# Patient Record
Sex: Male | Born: 1955 | Race: White | Hispanic: No | Marital: Married | Smoking: Never smoker
Health system: Southern US, Community
[De-identification: ages and names within clinical notes are randomized; demographics above are authoritative.]

## PROBLEM LIST (undated history)

## (undated) DIAGNOSIS — R112 Nausea with vomiting, unspecified: Secondary | ICD-10-CM

## (undated) DIAGNOSIS — E039 Hypothyroidism, unspecified: Secondary | ICD-10-CM

## (undated) DIAGNOSIS — T7840XA Allergy, unspecified, initial encounter: Secondary | ICD-10-CM

## (undated) DIAGNOSIS — N529 Male erectile dysfunction, unspecified: Secondary | ICD-10-CM

## (undated) DIAGNOSIS — Z9889 Other specified postprocedural states: Secondary | ICD-10-CM

## (undated) HISTORY — DX: Hypothyroidism, unspecified: E03.9

## (undated) HISTORY — PX: CARPAL TUNNEL RELEASE: SHX101

## (undated) HISTORY — PX: MENISCUS REPAIR: SHX5179

## (undated) HISTORY — PX: APPENDECTOMY: SHX54

## (undated) HISTORY — PX: TONSILLECTOMY: SUR1361

## (undated) HISTORY — PX: UMBILICAL HERNIA REPAIR: SHX196

## (undated) HISTORY — PX: OTHER SURGICAL HISTORY: SHX169

## (undated) HISTORY — DX: Allergy, unspecified, initial encounter: T78.40XA

## (undated) HISTORY — PX: SHOULDER SURGERY: SHX246

## (undated) HISTORY — DX: Male erectile dysfunction, unspecified: N52.9

## (undated) HISTORY — PX: COLONOSCOPY: SHX174

## (undated) HISTORY — DX: Other specified postprocedural states: Z98.890

## (undated) HISTORY — DX: Nausea with vomiting, unspecified: R11.2

## (undated) HISTORY — DX: Gilbert syndrome: E80.4

## (undated) HISTORY — PX: POLYPECTOMY: SHX149

---

## 2004-05-10 ENCOUNTER — Ambulatory Visit: Payer: Self-pay | Admitting: Internal Medicine

## 2004-05-27 ENCOUNTER — Ambulatory Visit: Payer: Self-pay | Admitting: Gastroenterology

## 2004-05-31 ENCOUNTER — Ambulatory Visit: Payer: Self-pay | Admitting: Internal Medicine

## 2004-05-31 ENCOUNTER — Ambulatory Visit: Payer: Self-pay | Admitting: Gastroenterology

## 2005-02-20 ENCOUNTER — Ambulatory Visit: Payer: Self-pay | Admitting: Internal Medicine

## 2005-04-07 ENCOUNTER — Ambulatory Visit: Payer: Self-pay | Admitting: Internal Medicine

## 2005-06-24 ENCOUNTER — Ambulatory Visit: Payer: Self-pay | Admitting: Internal Medicine

## 2005-10-01 ENCOUNTER — Ambulatory Visit (HOSPITAL_BASED_OUTPATIENT_CLINIC_OR_DEPARTMENT_OTHER): Admission: RE | Admit: 2005-10-01 | Discharge: 2005-10-01 | Payer: Self-pay | Admitting: Orthopedic Surgery

## 2006-04-20 ENCOUNTER — Ambulatory Visit: Payer: Self-pay | Admitting: Internal Medicine

## 2006-04-20 LAB — CONVERTED CEMR LAB: TSH: 4.92 microintl units/mL (ref 0.35–5.50)

## 2006-05-19 ENCOUNTER — Ambulatory Visit (HOSPITAL_BASED_OUTPATIENT_CLINIC_OR_DEPARTMENT_OTHER): Admission: RE | Admit: 2006-05-19 | Discharge: 2006-05-19 | Payer: Self-pay | Admitting: Orthopedic Surgery

## 2006-07-24 ENCOUNTER — Ambulatory Visit: Payer: Self-pay | Admitting: Internal Medicine

## 2006-07-24 LAB — CONVERTED CEMR LAB
ALT: 22 units/L (ref 0–40)
AST: 25 units/L (ref 0–37)
Albumin: 4.3 g/dL (ref 3.5–5.2)
Alkaline Phosphatase: 35 units/L — ABNORMAL LOW (ref 39–117)
BUN: 16 mg/dL (ref 6–23)
Basophils Absolute: 0 10*3/uL (ref 0.0–0.1)
Basophils Relative: 0.5 % (ref 0.0–1.0)
Bilirubin, Direct: 0.1 mg/dL (ref 0.0–0.3)
CO2: 28 meq/L (ref 19–32)
Calcium: 9.6 mg/dL (ref 8.4–10.5)
Chloride: 107 meq/L (ref 96–112)
Cholesterol: 202 mg/dL (ref 0–200)
Creatinine, Ser: 1.4 mg/dL (ref 0.4–1.5)
Direct LDL: 103.1 mg/dL
Eosinophils Absolute: 0.1 10*3/uL (ref 0.0–0.6)
Eosinophils Relative: 2.1 % (ref 0.0–5.0)
GFR calc Af Amer: 70 mL/min
GFR calc non Af Amer: 57 mL/min
Glucose, Bld: 86 mg/dL (ref 70–99)
HCT: 41.5 % (ref 39.0–52.0)
HDL: 73.2 mg/dL (ref >39.0)
Hemoglobin: 14.6 g/dL (ref 13.0–17.0)
Lymphocytes Relative: 41 % (ref 12.0–46.0)
MCHC: 35.2 g/dL (ref 30.0–36.0)
MCV: 92.5 fL (ref 78.0–100.0)
Monocytes Absolute: 0.5 10*3/uL (ref 0.2–0.7)
Monocytes Relative: 11.4 % — ABNORMAL HIGH (ref 3.0–11.0)
Neutro Abs: 2.2 10*3/uL (ref 1.4–7.7)
Neutrophils Relative %: 45 % (ref 43.0–77.0)
PSA: 0.98 ng/mL (ref 0.10–4.00)
Platelets: 210 10*3/uL (ref 150–400)
Potassium: 4.4 meq/L (ref 3.5–5.1)
RBC: 4.49 M/uL (ref 4.22–5.81)
RDW: 12.7 % (ref 11.5–14.6)
Sodium: 143 meq/L (ref 135–145)
TSH: 2.48 microintl units/mL (ref 0.35–5.50)
Total Bilirubin: 0.9 mg/dL (ref 0.3–1.2)
Total CHOL/HDL Ratio: 2.8
Total Protein: 6.8 g/dL (ref 6.0–8.3)
Triglycerides: 83 mg/dL (ref 0–149)
VLDL: 17 mg/dL (ref 0–40)
WBC: 4.8 10*3/uL (ref 4.5–10.5)

## 2006-10-15 DIAGNOSIS — Z9089 Acquired absence of other organs: Secondary | ICD-10-CM | POA: Insufficient documentation

## 2007-06-15 ENCOUNTER — Encounter (INDEPENDENT_AMBULATORY_CARE_PROVIDER_SITE_OTHER): Payer: Self-pay | Admitting: *Deleted

## 2007-06-15 ENCOUNTER — Telehealth (INDEPENDENT_AMBULATORY_CARE_PROVIDER_SITE_OTHER): Payer: Self-pay | Admitting: *Deleted

## 2007-06-16 ENCOUNTER — Telehealth (INDEPENDENT_AMBULATORY_CARE_PROVIDER_SITE_OTHER): Payer: Self-pay | Admitting: *Deleted

## 2007-06-18 ENCOUNTER — Encounter: Payer: Self-pay | Admitting: Internal Medicine

## 2007-08-30 ENCOUNTER — Ambulatory Visit: Payer: Self-pay | Admitting: Internal Medicine

## 2007-08-30 DIAGNOSIS — N400 Enlarged prostate without lower urinary tract symptoms: Secondary | ICD-10-CM | POA: Insufficient documentation

## 2007-08-30 DIAGNOSIS — E039 Hypothyroidism, unspecified: Secondary | ICD-10-CM | POA: Insufficient documentation

## 2007-08-30 DIAGNOSIS — J309 Allergic rhinitis, unspecified: Secondary | ICD-10-CM | POA: Insufficient documentation

## 2007-09-02 LAB — CONVERTED CEMR LAB
ALT: 23 units/L (ref 0–53)
AST: 28 units/L (ref 0–37)
Albumin: 4.3 g/dL (ref 3.5–5.2)
Alkaline Phosphatase: 35 units/L — ABNORMAL LOW (ref 39–117)
BUN: 16 mg/dL (ref 6–23)
Basophils Absolute: 0 10*3/uL (ref 0.0–0.1)
Basophils Relative: 0.6 % (ref 0.0–1.0)
Bilirubin, Direct: 0.1 mg/dL (ref 0.0–0.3)
CO2: 29 meq/L (ref 19–32)
Calcium: 9.7 mg/dL (ref 8.4–10.5)
Chloride: 107 meq/L (ref 96–112)
Cholesterol: 203 mg/dL (ref 0–200)
Creatinine, Ser: 1.3 mg/dL (ref 0.4–1.5)
Direct LDL: 107.2 mg/dL
Eosinophils Absolute: 0.1 10*3/uL (ref 0.0–0.6)
Eosinophils Relative: 2.5 % (ref 0.0–5.0)
GFR calc Af Amer: 75 mL/min
GFR calc non Af Amer: 62 mL/min
Glucose, Bld: 91 mg/dL (ref 70–99)
HCT: 42.5 % (ref 39.0–52.0)
HDL: 74.6 mg/dL (ref >39.0)
Hemoglobin: 14.2 g/dL (ref 13.0–17.0)
Lymphocytes Relative: 42.2 % (ref 12.0–46.0)
MCHC: 33.3 g/dL (ref 30.0–36.0)
MCV: 96.1 fL (ref 78.0–100.0)
Monocytes Absolute: 0.4 10*3/uL (ref 0.2–0.7)
Monocytes Relative: 11.2 % — ABNORMAL HIGH (ref 3.0–11.0)
Neutro Abs: 1.7 10*3/uL (ref 1.4–7.7)
Neutrophils Relative %: 43.5 % (ref 43.0–77.0)
PSA: 0.81 ng/mL (ref 0.10–4.00)
Platelets: 207 10*3/uL (ref 150–400)
Potassium: 4.5 meq/L (ref 3.5–5.1)
RBC: 4.42 M/uL (ref 4.22–5.81)
RDW: 12.8 % (ref 11.5–14.6)
Sodium: 143 meq/L (ref 135–145)
TSH: 6.25 microintl units/mL — ABNORMAL HIGH (ref 0.35–5.50)
Total Bilirubin: 0.9 mg/dL (ref 0.3–1.2)
Total CHOL/HDL Ratio: 2.7
Total Protein: 7 g/dL (ref 6.0–8.3)
Triglycerides: 43 mg/dL (ref 0–149)
VLDL: 9 mg/dL (ref 0–40)
WBC: 4 10*3/uL — ABNORMAL LOW (ref 4.5–10.5)

## 2007-09-06 ENCOUNTER — Telehealth (INDEPENDENT_AMBULATORY_CARE_PROVIDER_SITE_OTHER): Payer: Self-pay | Admitting: *Deleted

## 2007-09-06 ENCOUNTER — Ambulatory Visit: Payer: Self-pay | Admitting: Internal Medicine

## 2007-09-06 ENCOUNTER — Encounter (INDEPENDENT_AMBULATORY_CARE_PROVIDER_SITE_OTHER): Payer: Self-pay | Admitting: *Deleted

## 2007-09-06 LAB — CONVERTED CEMR LAB
OCCULT 1: NEGATIVE
OCCULT 2: NEGATIVE
OCCULT 3: NEGATIVE

## 2008-08-29 ENCOUNTER — Encounter (INDEPENDENT_AMBULATORY_CARE_PROVIDER_SITE_OTHER): Payer: Self-pay | Admitting: *Deleted

## 2008-09-06 ENCOUNTER — Telehealth (INDEPENDENT_AMBULATORY_CARE_PROVIDER_SITE_OTHER): Payer: Self-pay | Admitting: *Deleted

## 2008-09-12 ENCOUNTER — Telehealth (INDEPENDENT_AMBULATORY_CARE_PROVIDER_SITE_OTHER): Payer: Self-pay | Admitting: *Deleted

## 2008-09-13 ENCOUNTER — Ambulatory Visit: Payer: Self-pay | Admitting: Internal Medicine

## 2008-09-19 ENCOUNTER — Telehealth (INDEPENDENT_AMBULATORY_CARE_PROVIDER_SITE_OTHER): Payer: Self-pay | Admitting: *Deleted

## 2008-09-19 LAB — CONVERTED CEMR LAB
ALT: 50 units/L (ref 0–53)
AST: 30 units/L (ref 0–37)
Albumin: 3.9 g/dL (ref 3.5–5.2)
Alkaline Phosphatase: 66 units/L (ref 39–117)
Basophils Relative: 0.8 % (ref 0.0–3.0)
CO2: 30 meq/L (ref 19–32)
Calcium: 9.5 mg/dL (ref 8.4–10.5)
GFR calc non Af Amer: 67.83 mL/min (ref >60)
HDL: 73.3 mg/dL (ref >39.00)
Hemoglobin: 13.9 g/dL (ref 13.0–17.0)
Lymphocytes Relative: 36.1 % (ref 12.0–46.0)
MCHC: 33.4 g/dL (ref 30.0–36.0)
Monocytes Relative: 11.2 % (ref 3.0–12.0)
Neutro Abs: 2.1 10*3/uL (ref 1.4–7.7)
RBC: 4.36 M/uL (ref 4.22–5.81)
Sodium: 144 meq/L (ref 135–145)
Total CHOL/HDL Ratio: 2
Total Protein: 6.5 g/dL (ref 6.0–8.3)

## 2008-10-06 ENCOUNTER — Telehealth (INDEPENDENT_AMBULATORY_CARE_PROVIDER_SITE_OTHER): Payer: Self-pay | Admitting: *Deleted

## 2008-10-11 ENCOUNTER — Ambulatory Visit: Payer: Self-pay | Admitting: Internal Medicine

## 2008-10-11 DIAGNOSIS — R972 Elevated prostate specific antigen [PSA]: Secondary | ICD-10-CM | POA: Insufficient documentation

## 2008-10-13 ENCOUNTER — Encounter (INDEPENDENT_AMBULATORY_CARE_PROVIDER_SITE_OTHER): Payer: Self-pay | Admitting: *Deleted

## 2008-10-23 ENCOUNTER — Telehealth (INDEPENDENT_AMBULATORY_CARE_PROVIDER_SITE_OTHER): Payer: Self-pay | Admitting: *Deleted

## 2009-01-10 ENCOUNTER — Telehealth (INDEPENDENT_AMBULATORY_CARE_PROVIDER_SITE_OTHER): Payer: Self-pay | Admitting: *Deleted

## 2009-04-13 ENCOUNTER — Encounter (INDEPENDENT_AMBULATORY_CARE_PROVIDER_SITE_OTHER): Payer: Self-pay | Admitting: *Deleted

## 2009-07-09 ENCOUNTER — Telehealth (INDEPENDENT_AMBULATORY_CARE_PROVIDER_SITE_OTHER): Payer: Self-pay | Admitting: *Deleted

## 2009-09-07 ENCOUNTER — Ambulatory Visit: Payer: Self-pay | Admitting: Family Medicine

## 2009-09-27 ENCOUNTER — Telehealth (INDEPENDENT_AMBULATORY_CARE_PROVIDER_SITE_OTHER): Payer: Self-pay | Admitting: *Deleted

## 2009-10-29 ENCOUNTER — Ambulatory Visit: Payer: Self-pay | Admitting: Internal Medicine

## 2009-11-06 LAB — CONVERTED CEMR LAB
ALT: 24 units/L (ref 0–53)
AST: 34 units/L (ref 0–37)
Alkaline Phosphatase: 40 units/L (ref 39–117)
Eosinophils Relative: 3.4 % (ref 0.0–5.0)
GFR calc non Af Amer: 68.19 mL/min (ref >60)
HCT: 40.6 % (ref 39.0–52.0)
Hemoglobin: 13.9 g/dL (ref 13.0–17.0)
LDL Cholesterol: 110 mg/dL — ABNORMAL HIGH (ref 0–99)
Lymphocytes Relative: 37.7 % (ref 12.0–46.0)
Lymphs Abs: 1.6 10*3/uL (ref 0.7–4.0)
Monocytes Relative: 12.8 % — ABNORMAL HIGH (ref 3.0–12.0)
Neutro Abs: 1.9 10*3/uL (ref 1.4–7.7)
Platelets: 189 10*3/uL (ref 150.0–400.0)
Potassium: 5.1 meq/L (ref 3.5–5.1)
Sodium: 140 meq/L (ref 135–145)
TSH: 5.46 microintl units/mL (ref 0.35–5.50)
Total Bilirubin: 1.1 mg/dL (ref 0.3–1.2)
VLDL: 4.2 mg/dL (ref 0.0–40.0)
WBC: 4.2 10*3/uL — ABNORMAL LOW (ref 4.5–10.5)

## 2010-02-20 ENCOUNTER — Encounter (INDEPENDENT_AMBULATORY_CARE_PROVIDER_SITE_OTHER): Payer: Self-pay | Admitting: *Deleted

## 2010-02-22 ENCOUNTER — Ambulatory Visit: Payer: Self-pay | Admitting: Gastroenterology

## 2010-03-01 ENCOUNTER — Ambulatory Visit: Payer: Self-pay | Admitting: Gastroenterology

## 2010-05-13 ENCOUNTER — Telehealth: Payer: Self-pay | Admitting: Internal Medicine

## 2010-05-15 ENCOUNTER — Encounter: Payer: Self-pay | Admitting: Internal Medicine

## 2010-05-16 ENCOUNTER — Encounter: Payer: Self-pay | Admitting: Internal Medicine

## 2010-06-17 ENCOUNTER — Telehealth: Payer: Self-pay | Admitting: Internal Medicine

## 2010-06-21 ENCOUNTER — Ambulatory Visit
Admission: RE | Admit: 2010-06-21 | Discharge: 2010-06-21 | Payer: Self-pay | Source: Home / Self Care | Attending: Internal Medicine | Admitting: Internal Medicine

## 2010-06-21 ENCOUNTER — Other Ambulatory Visit: Payer: Self-pay | Admitting: Internal Medicine

## 2010-06-21 LAB — CBC WITH DIFFERENTIAL/PLATELET
Basophils Absolute: 0 10*3/uL (ref 0.0–0.1)
Basophils Relative: 0.5 % (ref 0.0–3.0)
Eosinophils Absolute: 0.1 10*3/uL (ref 0.0–0.7)
Eosinophils Relative: 1.7 % (ref 0.0–5.0)
HCT: 43.4 % (ref 39.0–52.0)
Hemoglobin: 14.6 g/dL (ref 13.0–17.0)
Lymphocytes Relative: 38.4 % (ref 12.0–46.0)
Lymphs Abs: 1.7 10*3/uL (ref 0.7–4.0)
MCHC: 33.7 g/dL (ref 30.0–36.0)
MCV: 96.1 fl (ref 78.0–100.0)
Monocytes Absolute: 0.5 10*3/uL (ref 0.1–1.0)
Monocytes Relative: 12.1 % — ABNORMAL HIGH (ref 3.0–12.0)
Neutro Abs: 2.1 10*3/uL (ref 1.4–7.7)
Neutrophils Relative %: 47.3 % (ref 43.0–77.0)
Platelets: 193 10*3/uL (ref 150.0–400.0)
RBC: 4.52 Mil/uL (ref 4.22–5.81)
RDW: 13.7 % (ref 11.5–14.6)
WBC: 4.5 10*3/uL (ref 4.5–10.5)

## 2010-06-21 LAB — HEPATIC FUNCTION PANEL
ALT: 21 U/L (ref 0–53)
AST: 28 U/L (ref 0–37)
Albumin: 4.5 g/dL (ref 3.5–5.2)
Alkaline Phosphatase: 39 U/L (ref 39–117)
Bilirubin, Direct: 0.1 mg/dL (ref 0.0–0.3)
Total Bilirubin: 0.9 mg/dL (ref 0.3–1.2)
Total Protein: 7.2 g/dL (ref 6.0–8.3)

## 2010-06-21 LAB — BASIC METABOLIC PANEL
BUN: 15 mg/dL (ref 6–23)
CO2: 30 mEq/L (ref 19–32)
Calcium: 10.4 mg/dL (ref 8.4–10.5)
Chloride: 107 mEq/L (ref 96–112)
Creatinine, Ser: 1.4 mg/dL (ref 0.4–1.5)
GFR: 54.58 mL/min — ABNORMAL LOW (ref >60.00)
Glucose, Bld: 85 mg/dL (ref 70–99)
Potassium: 5.7 mEq/L — ABNORMAL HIGH (ref 3.5–5.1)
Sodium: 144 mEq/L (ref 135–145)

## 2010-06-21 LAB — LIPID PANEL
Cholesterol: 190 mg/dL (ref 0–200)
HDL: 75.7 mg/dL (ref >39.00)
LDL Cholesterol: 104 mg/dL — ABNORMAL HIGH (ref 0–99)
Total CHOL/HDL Ratio: 3
Triglycerides: 52 mg/dL (ref 0.0–149.0)
VLDL: 10.4 mg/dL (ref 0.0–40.0)

## 2010-06-21 LAB — CONVERTED CEMR LAB
Ketones, urine, test strip: NEGATIVE
Nitrite: NEGATIVE
Protein, U semiquant: NEGATIVE
Urobilinogen, UA: NEGATIVE

## 2010-06-21 LAB — TSH: TSH: 2.69 u[IU]/mL (ref 0.35–5.50)

## 2010-06-21 LAB — TESTOSTERONE: Testosterone: 534.81 ng/dL (ref 350.00–890.00)

## 2010-06-21 LAB — PSA: PSA: 0.94 ng/mL (ref 0.10–4.00)

## 2010-07-01 ENCOUNTER — Telehealth (INDEPENDENT_AMBULATORY_CARE_PROVIDER_SITE_OTHER): Payer: Self-pay | Admitting: *Deleted

## 2010-07-03 ENCOUNTER — Ambulatory Visit
Admission: RE | Admit: 2010-07-03 | Discharge: 2010-07-03 | Payer: Self-pay | Source: Home / Self Care | Attending: Internal Medicine | Admitting: Internal Medicine

## 2010-07-03 LAB — CONVERTED CEMR LAB
OCCULT 1: NEGATIVE
OCCULT 2: NEGATIVE

## 2010-07-04 ENCOUNTER — Ambulatory Visit
Admission: RE | Admit: 2010-07-04 | Discharge: 2010-07-04 | Payer: Self-pay | Source: Home / Self Care | Attending: Internal Medicine | Admitting: Internal Medicine

## 2010-07-04 DIAGNOSIS — F528 Other sexual dysfunction not due to a substance or known physiological condition: Secondary | ICD-10-CM | POA: Insufficient documentation

## 2010-07-04 DIAGNOSIS — R41840 Attention and concentration deficit: Secondary | ICD-10-CM | POA: Insufficient documentation

## 2010-07-09 NOTE — Progress Notes (Signed)
Summary: REFILL VOLTAREN  Phone Note Call from Patient Call back at Home Phone [phone]   Caller: Patient Call For: Marga Melnick MD Reason for Call: Refill Medication Summary of Call: PATIENT CALLING REQUESTING REFILL OF VOLTAREN FOR HIS TENDONITIS, BE SENT TO [facility] [address]  PT LAST OV 10/2008, HE IS SCH'D FOR CPX IN MAY-2011.  PLEASE LET PT KNOW WHEN DONE, OK TO LEAVE VOICEMAIL. Initial call taken by: Magdalen Spatz [facility],  July 09, 2009 2:08 PM  Follow-up for Phone Call        Patient aware rx sent Follow-up by: Shonna Chock,  July 09, 2009 2:35 PM    Prescriptions: VOLTAREN 1 % GEL (DICLOFENAC SODIUM) apply two times a day after  elbow exercises  #100 grams x 2   Entered by:   Shonna Chock   Authorized by:   Marga Melnick MD   Signed by:   Shonna Chock on 07/09/2009   Method used:   Electronically to        [facility] [phone]* (retail)       [address]       [place], [place]  [address]       Ph: [phone] or [phone]       Fax: [phone]   RxID:   [id number]

## 2010-07-09 NOTE — Progress Notes (Signed)
Summary: PRIOR AUTH--Voltaren  Phone Note Refill Request Message from:  Fax from Pharmacy on May 13, 2010 4:15 PM  Refills Requested: Medication #1:  VOLTAREN 1 % GEL apply two times a day after  elbow exercises Allena Katz [id number] - ID [id number]  Initial call taken by: Dannielle Karvonen,  May 13, 2010 4:15 PM  Follow-up for Phone Call        Forms requested. Lucious Groves CMA  May 14, 2010 3:24 PM   Additional Follow-up for Phone Call Additional follow up Details #1::        see form Additional Follow-up by: Marga Melnick MD,  May 15, 2010 11:56 AM    Additional Follow-up for Phone Call Additional follow up Details #2::    Faxed and will await insurance company reply. Lucious Groves CMA  May 15, 2010 12:12 PM    Appended Document: PRIOR AUTH--Voltaren approved until 02/2013.

## 2010-07-09 NOTE — Miscellaneous (Signed)
Summary: previsit prep/rm  Clinical Lists Changes  Medications: Added new medication of MOVIPREP 100 GM  SOLR (PEG-KCL-NACL-NASULF-NA ASC-C) As per prep instructions. - Signed Rx of MOVIPREP 100 GM  SOLR (PEG-KCL-NACL-NASULF-NA ASC-C) As per prep instructions.;  #1 x 0;  Signed;  Entered by: Sherren Kerns RN;  Authorized by: Meryl Dare MD [facility];  Method used: Electronically to [facility]  [phone]*, [address] [place], [place]  [address], Ph: [phone] or [phone], Fax: [phone]    Prescriptions: MOVIPREP 100 GM  SOLR (PEG-KCL-NACL-NASULF-NA ASC-C) As per prep instructions.  #1 x 0   Entered by:   Sherren Kerns RN   Authorized by:   Meryl Dare MD [facility]   Signed by:   Sherren Kerns RN on 02/22/2010   Method used:   Electronically to        [facility]  [phone]* (retail)       [address] [place], [place]  [address]       Ph: [phone] or [phone]       Fax: [phone]   RxID:   [id number]

## 2010-07-09 NOTE — Progress Notes (Signed)
Summary: HOP---RX  Phone Note Refill Request   Refills Requested: Medication #1:  SYNTHROID 150 MCG  TABS 1po once daily except 1 and 1/2 tabs on tues and thurs [facility]--[phone]  Initial call taken by: Freddy Jaksch,  September 12, 2008 9:06 AM      Prescriptions: SYNTHROID 150 MCG  TABS (LEVOTHYROXINE SODIUM) 1po once daily except 1 and 1/2 tabs on tues and thurs  #102 x 0   Entered by:   Jeremy Johann CMA   Authorized by:   Marga Melnick MD   Signed by:   Jeremy Johann CMA on 09/12/2008   Method used:   Faxed to ...       [facility] [address] (retail)       [address]       [place], [place]  [address]       Ph: [phone] or [phone]       Fax: [phone]   RxID:   [id number]

## 2010-07-09 NOTE — Assessment & Plan Note (Signed)
Summary: cpx/ns/kdc   Vital Signs:  Patient profile:   55 year old male Height:      74 inches Weight:      192.4 pounds BMI:     24.79 Temp:     97.6 degrees F oral Pulse rate:   45 / minute Resp:     14 per minute BP sitting:   110 / 70  (left arm) Cuff size:   large  Vitals Entered By: Shonna Chock (Oct 29, 2009 8:35 AM)  Comments REVIEWED MED LIST, PATIENT AGREED DOSE AND INSTRUCTION CORRECT    Primary Care Provider:  [facility]   History of Present Illness: Timothy [name] is here for a physical; he is asymptomatic.  Allergies (verified): No Known Drug Allergies  Past History:  Past Medical History: 1999 post allergic Reactive Airway Disease (self limited brochosasm); no recurrence , no PMH asthma GILBERT'S SYNDROME (ICD-277.4) insignificant click at apex elevated homocysteine , normal lipids, normal CRP; NMR 06/2005: LDL 100 with 818 total particles & 167 small dense particles(LDL goal = < 160, ideally < 120) Hypothyroidism  Past Surgical History: Appendectomy Tonsillectomy colonoscopy  05/2004 negative, due 2011 CTS 10/18/2005, surgery bilaterally Benign nevi removed X3, Dr Danella Deis, Dermatologist  Family History: father: thyroid disease, colon polyps , MI @ 43 mother: thyroid disease, MI,d  @ 72, colon polyps three brothers: hypothyroidism; bro : melanoma. 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    Physical Exam  General:  Well-developed,well-nourished; alert,appropriate and cooperative throughout examination Head:  Normocephalic and atraumatic without obvious abnormalities. No apparent alopecia Eyes:  No corneal or conjunctival inflammation noted.Perrla. Funduscopic exam benign, without hemorrhages, exudates or papilledema.  Ears:  External ear exam shows no significant lesions or deformities.  Otoscopic examination reveals clear canals, tympanic membranes are intact bilaterally without bulging, retraction, inflammation or discharge. Hearing is grossly normal bilaterally. Nose:  External nasal examination shows no deformity or inflammation. Nasal mucosa are pink and moist without lesions or exudates. Mouth:  Oral mucosa and oropharynx without lesions or exudates.  Teeth in good repair. Neck:  No deformities, masses, or tenderness noted. Lungs:  Normal respiratory effort, chest expands symmetrically. Lungs are clear to auscultation, no crackles or wheezes. Heart:  regular rhythm, no murmur, no gallop, no rub, no JVD, no HJR, and bradycardia.  Intermittent click @ apex Abdomen:  Bowel sounds positive,abdomen scaphoid, soft and non-tender without masses, organomegaly or hernias noted. Rectal:  No external abnormalities noted. Normal sphincter tone. No rectal masses or tenderness. Genitalia:  Testes bilaterally descended without nodularity, tenderness or masses. No scrotal masses or lesions. No penis lesions or urethral discharge.L varicocele.   Prostate:  no nodules, no asymmetry, no induration, and 1+ enlarged.   Msk:  No deformity or scoliosis noted of thoracic or lumbar spine.   Pulses:  R and L carotid,radial,dorsalis pedis and posterior tibial pulses are full and equal bilaterally Extremities:  No clubbing, cyanosis, edema, or deformity noted with normal full range of motion of all joints.   Neurologic:  alert &  oriented X3 and DTRs symmetrical  and normal.   Skin:  Solar changes; scattered nevi Cervical Nodes:  No lymphadenopathy noted Axillary Nodes:  No palpable lymphadenopathy Inguinal Nodes:  No significant adenopathy Psych:  memory intact for recent and remote, normally interactive, and good eye contact.     Impression & Recommendations:  Problem # 1:  ROUTINE GENERAL MEDICAL EXAM@HEALTH  CARE FACL (ICD-V70.0)  Orders: EKG w/ Interpretation ([address]) Venipuncture ([address]) TLB-Lipid Panel (80061-LIPID) TLB-BMP (Basic Metabolic Panel-BMET) (80048-METABOL) TLB-CBC Platelet - w/Differential (85025-CBCD) TLB-Hepatic/Liver Function Pnl (80076-HEPATIC) TLB-TSH (Thyroid Stimulating Hormone) (84443-TSH) TLB-PSA (Prostate Specific Antigen) (84153-PSA)  Problem # 2:  HYPOTHYROIDISM (ICD-244.9)  His updated medication list for this problem includes:    Synthroid 150 Mcg Tabs (Levothyroxine sodium) .Marland Kitchen... 1po once daily except 1 and 1/2 tabs on tues and thurs  Orders: TLB-TSH (Thyroid Stimulating Hormone) (84443-TSH)  Problem # 3:  HYPERPLASIA PROSTATE UNS W/O UR OBST & OTH LUTS (ICD-600.90)  Orders: TLB-PSA (Prostate Specific Antigen) (84153-PSA)  Complete Medication List: 1)  Nasacort Aq Aers (Triamcinolone acetonide(nasal) aers) .Marland Kitchen.. 1-2 sprays each nostril once daily as needed 2)  Synthroid 150 Mcg Tabs (Levothyroxine sodium) .Marland Kitchen.. 1po once daily except 1 and 1/2 tabs on tues and thurs 3)  Cialis 20 Mg Tabs (Tadalafil) .Marland Kitchen.. 1 by mouth as needed 4)  Folic Acid  .... Once daily 5)  Voltaren 1 % Gel (Diclofenac sodium) .... Apply two times a day after  elbow exercises 6)  Ciprofloxacin Hcl 500 Mg Tabs (Ciprofloxacin hcl) .Marland Kitchen.. 1 two times a day x15 days  Patient Instructions: 1)  Share correctied record & labs with all MDs seen.Schedule a colonoscopy  to help detect colon cancer as per GI recommendations.    Appended Document: cpx/ns/kdc  Laboratory Results   Urine Tests   Date/Time Reported: Oct 29, 2009  11:07 AM   Routine Urinalysis   Color: yellow Appearance: Clear Glucose: negative   (Normal Range: Negative) Bilirubin: negative   (Normal Range: Negative) Ketone: negative   (Normal Range: Negative) Spec. Gravity: <1.005   (Normal Range: 1.003-1.035) Blood: negative   (Normal Range: Negative) pH: 8.0   (Normal Range: 5.0-8.0) Protein: negative   (Normal Range: Negative) Urobilinogen: negative   (Normal Range: 0-1) Nitrite: negative   (Normal Range: Negative) Leukocyte Esterace: negative   (Normal Range: Negative)    Comments: Floydene Flock  Oct 29, 2009 11:07 AM

## 2010-07-09 NOTE — Progress Notes (Signed)
Summary: Refill Request  Phone Note Refill Request Message from:  Pharmacy  Refills Requested: Medication #1:  SYNTHROID 150 MCG  TABS 1po once daily except 1 and 1/2 tabs on tues and thurs [facility]   Method Requested: Fax to [facility]    Prescriptions: SYNTHROID 150 MCG  TABS (LEVOTHYROXINE SODIUM) 1po once daily except 1 and 1/2 tabs on tues and thurs  #102 x 0   Entered by:   Shonna Chock   Authorized by:   Marga Melnick MD   Signed by:   Shonna Chock on 09/27/2009   Method used:   Faxed to ...       [facility]* (mail-order)             ,          Ph: [phone]       Fax: [phone]   RxID:   [id number]

## 2010-07-09 NOTE — Procedures (Signed)
Summary: Colonoscopy  Patient: Timothy [name] Note: All result statuses are Final unless otherwise noted.  Tests: (1) Colonoscopy (COL)   COL Colonoscopy           DONE     [facility]     [address]     [place], [place]  [address]           COLONOSCOPY PROCEDURE REPORT     PATIENT:  Antionio, Negron  MR#:  [id number]     BIRTHDATE:  Oct 21, 1955, 54 yrs. old  GENDER:  male     ENDOSCOPIST:  Judie Petit T. Russella Dar, MD, [facility]           PROCEDURE DATE:  03/01/2010     PROCEDURE:  Colonoscopy 19[address]     ASA CLASS:  Class I     INDICATIONS:  1) Elevated Risk Screening  2) family Hx of polyps,     both parents with colon polyp in their late 28s.     MEDICATIONS:   Fentanyl 75 mcg IV, Versed 10 mg IV     DESCRIPTION OF PROCEDURE:   After the risks benefits and     alternatives of the procedure were thoroughly explained, informed     consent was obtained.  Digital rectal exam was performed and     revealed no abnormalities.   The LB [id number] endoscope     was introduced through the anus and advanced to the cecum, which     was identified by both the appendix and ileocecal valve, without     limitations.  The quality of the prep was excellent, using     MoviPrep.  The instrument was then slowly withdrawn as the colon     was fully examined.     <<PROCEDUREIMAGES>>     FINDINGS:  A normal appearing cecum, ileocecal valve, and     appendiceal orifice were identified. The ascending, hepatic     flexure, transverse, splenic flexure, descending, sigmoid colon,     and rectum appeared unremarkable.  Retroflexed views in the rectum     revealed internal hemorrhoids, small.  The time to cecum =  2.5     minutes. The scope was then withdrawn (time =  8  min) from the     patient and the procedure completed.     COMPLICATIONS:  None           ENDOSCOPIC IMPRESSION:     1) Normal colon     2) Internal hemorrhoids           RECOMMENDATIONS:     1) Repeat Colonoscopy in 5 years.           Venita Lick. Russella Dar, MD, [facility]           CC: Pecola Lawless, MD           n.     Rosalie DoctorVenita Lick. Rayaan Lorah at 03/01/2010 08:48 AM           Artis Delay, [phone]  Note: An exclamation mark (!) indicates a result that was not dispersed into the flowsheet. Document Creation Date: 03/01/2010 8:48 AM _______________________________________________________________________  (1) Order result status: Final Collection or observation date-time: 03/01/2010 08:45 Requested date-time:  Receipt date-time:  Reported date-time:  Referring Physician:   Ordering Physician: Claudette Head [phone]) Specimen Source:  Source: Launa Grill Order Number: [id number] Lab site:   Appended Document: Colonoscopy    Clinical Lists Changes  Observations: Added new  observation of COLONNXTDUE: 02/2015 (03/01/2010 10:38)

## 2010-07-09 NOTE — Assessment & Plan Note (Signed)
Summary: hard to hear out of right ear/kdc   Vital Signs:  Patient profile:   55 year old male Height:      73.5 inches Weight:      190 pounds BMI:     24.82 Temp:     98.1 degrees F oral Pulse rate:   62 / minute Pulse rhythm:   regular BP sitting:   112 / 70  (left arm) Cuff size:   large  Vitals Entered By: Army Fossa CMA (September 07, 2009 2:01 PM) CC: Pt here having trouble hearing out right ear, he states he believes it is allergies. Feeling better today but going scuba diving next week would like for you to check it., Ear pain   History of Present Illness:  Ear Pain      This is a 55 year old man who presents with Ear pain.  The patient complains of sensation of fullness, but denies ear discharge, hearing loss, tinnitus, fever, sinus pain, nasal discharge, and jaw click.  The pain is located in the right ear.  The pain is described as intermittent.  The patient denies headache, night grinding of teeth, popping or crackling sounds, pressure, toothache, dizziness, and vertigo.  Patient's history includes hay fever/allergies.  Prior treatment has included nasal steroids.    Preventive Screening-Counseling & Management  Alcohol-Tobacco     Alcohol type: socially     Smoking Status: never  Caffeine-Diet-Exercise     Does Patient Exercise: yes     Type of exercise: CVE or weights daily 40-60 min w/o symptoms  Current Medications (verified): 1)  Nasacort Aq   Aers (Triamcinolone Acetonide(Nasal) Aers) .Marland Kitchen.. 1-2 Sprays Each Nostril Once Daily As Needed 2)  Synthroid 150 Mcg  Tabs (Levothyroxine Sodium) .Marland Kitchen.. 1po Once Daily Except 1 and 1/2 Tabs On Tues and Thurs 3)  Cialis 20 Mg  Tabs (Tadalafil) .Marland Kitchen.. 1 By Mouth As Needed 4)  Folic Acid .... Once Daily 5)  Voltaren 1 % Gel (Diclofenac Sodium) .... Apply Two Times A Day After  Elbow Exercises 6)  Ciprofloxacin Hcl 500 Mg Tabs (Ciprofloxacin Hcl) .Marland Kitchen.. 1 Two Times A Day X15 Days  Allergies (verified): No Known Drug  Allergies  Past History:  Past Medical History: Last updated: 09/13/2008 1999 post allergic Reactive Airway Disease; no recurrence , no PMH asthma GILBERT'S SYNDROME (ICD-277.4) insignificant click at apex elevevated homocysteine , normal lipids, normal C RP; NMR 06/2005: LDL 100 with 818 total particles & 167 small dense particles(LDL goal = < 160, ideally < 120) eosinophilia  Hypothyroidism  Past Surgical History: Last updated: 09/13/2008 Appendectomy Tonsillectomy colonoscopy  05/2004 negative CTS 10/18/2005, surgery bilat Benign nevi removed X3, Dr Danella Deis, [facility]  Family History: Last updated: 09/13/2008 father thyroid disease, colon polyps , MI @ 25 mother thyroid disease, MI,d  @ 47, colon polyps three brothers hypothyroidism; bro melanoma  Social History: Last updated: 09/13/2008 Never Smoked; on heart healthy diet Alcohol use-yes, socially Regular exercise-yes: 5-7 X/week for 60 min 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 father thyroid disease, colon polyps , MI @ 52 mother thyroid disease, MI,d  @ 35, colon polyps three brothers hypothyroidism; bro melanoma  Social History: Reviewed history from 09/13/2008 and no changes required. 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   Nose:  External nasal examination shows no deformity or inflammation. Nasal mucosa are pink and moist without lesions or exudates. Mouth:  Oral mucosa and oropharynx without  lesions or exudates.  Teeth in good repair. Neck:  No deformities, masses, or tenderness noted. Lungs:  Normal respiratory effort, chest expands symmetrically. Lungs are clear to auscultation, no crackles or wheezes. Heart:  normal rate and no murmur.   Psych:  Cognition and judgment appear intact. Alert and cooperative with normal attention span and concentration. No apparent delusions, illusions, hallucinations   Impression & Recommendations:  Problem # 1:  OTITIS MEDIA, SEROUS (ICD-381.4) decongestants for 7-10 days only use nasocort f/u hopp prn  Complete Medication List: 1)  Nasacort Aq Aers (Triamcinolone acetonide(nasal) aers) .Marland Kitchen.. 1-2 sprays each nostril once daily as needed 2)  Synthroid 150 Mcg Tabs (Levothyroxine sodium) .Marland Kitchen.. 1po once daily except 1 and 1/2 tabs on tues and thurs 3)  Cialis 20 Mg Tabs (Tadalafil) .Marland Kitchen.. 1 by mouth as needed 4)  Folic Acid  .... Once daily 5)  Voltaren 1 % Gel (Diclofenac sodium) .... Apply two times a day after  elbow exercises 6)  Ciprofloxacin Hcl 500 Mg Tabs (Ciprofloxacin hcl) .Marland Kitchen.. 1 two times a day x15 days

## 2010-07-09 NOTE — Letter (Signed)
Summary: [facility] Instructions  [facility]  [address] [place], [place] [address]   Phone: [phone]  Fax: [phone]       DONNAVIN VANDENBRINK    February 23, 1956    MRN: [id number]        Procedure Day /Date: Friday 03/01/2010     Arrival Time: 07:30AM     Procedure Time: 08:30AM     Location of Procedure:                    [facility]res _  [facility] (4th Floor)                       PREPARATION FOR COLONOSCOPY WITH MOVIPREP   Starting 5 days prior to your procedure 02/24/2010 do not eat nuts, seeds, popcorn, corn, beans, peas,  salads, or any raw vegetables.  Do not take any fiber supplements (e.g. Metamucil, Citrucel, and Benefiber).  THE DAY BEFORE YOUR PROCEDURE         DATE: 09/22     DAY: Thursday  1.  Drink clear liquids the entire day-NO SOLID FOOD  2.  Do not drink anything colored red or purple.  Avoid juices with pulp.  No orange juice.  3.  Drink at least 64 oz. (8 glasses) of fluid/clear liquids during the day to prevent dehydration and help the prep work efficiently.  CLEAR LIQUIDS INCLUDE: Water Jello Ice Popsicles Tea (sugar ok, no milk/cream) Powdered fruit flavored drinks Coffee (sugar ok, no milk/cream) Gatorade Juice: apple, white grape, white cranberry  Lemonade Clear bullion, consomm, broth Carbonated beverages (any kind) Strained chicken noodle soup Hard Candy                             4.  In the morning, mix first dose of MoviPrep solution:    Empty 1 Pouch A and 1 Pouch B into the disposable container    Add lukewarm drinking water to the top line of the container. Mix to dissolve    Refrigerate (mixed solution should be used within 24 hrs)  5.  Begin drinking the prep at 5:00 p.m. The MoviPrep container is divided by 4 marks.   Every 15 minutes drink the solution down to the next mark (approximately 8 oz) until the full liter is complete.   6.  Follow completed prep with 16 oz of clear liquid of your choice  (Nothing red or purple).  Continue to drink clear liquids until bedtime.  7.  Before going to bed, mix second dose of MoviPrep solution:    Empty 1 Pouch A and 1 Pouch B into the disposable container    Add lukewarm drinking water to the top line of the container. Mix to dissolve    Refrigerate  THE DAY OF YOUR PROCEDURE      DATE: 09/23      DAY: Friday  Beginning at 03:30AM   (5 hours before procedure):         1. Every 15 minutes, drink the solution down to the next mark (approx 8 oz) until the full liter is complete.  2. Follow completed prep with 16 oz. of clear liquid of your choice.    3. You may drink clear liquids until 06:30AM (2 HOURS BEFORE PROCEDURE).   MEDICATION INSTRUCTIONS  Unless otherwise instructed, you should take regular prescription medications with a small sip of water   as early  as possible the morning of your procedure.   Additional medication instructions: n/a         OTHER INSTRUCTIONS  You will need a responsible adult at least 53 years of age to accompany you and drive you home.   This person must remain in the waiting room during your procedure.  Wear loose fitting clothing that is easily removed.  Leave jewelry and other valuables at home.  However, you may wish to bring a book to read or  an iPod/MP3 player to listen to music as you wait for your procedure to start.  Remove all body piercing jewelry and leave at home.  Total time from sign-in until discharge is approximately 2-3 hours.  You should go home directly after your procedure and rest.  You can resume normal activities the  day after your procedure.  The day of your procedure you should not:   Drive   Make legal decisions   Operate machinery   Drink alcohol   Return to work  You will receive specific instructions about eating, activities and medications before you leave.    The above instructions have been reviewed and explained to me by   Sherren Kerns RN   February 22, 2010 8:43 AM     I fully understand and can verbalize these instructions _____________________________ Date _________

## 2010-07-11 NOTE — Medication Information (Signed)
Summary: Prior Authorization for Voltaren Gel/[employer]  Prior Authorization for Voltaren Gel/[employer]   Imported By: Lanelle Bal 06/04/2010 08:48:47  _____________________________________________________________________  External Attachment:    Type:   Image     Comment:   External Document

## 2010-07-11 NOTE — Medication Information (Signed)
Summary: Approval for Voltaren Gel/[employer]  Approval for Voltaren Gel/[employer]   Imported By: Lanelle Bal 05/22/2010 15:59:16  _____________________________________________________________________  External Attachment:    Type:   Image     Comment:   External Document

## 2010-07-11 NOTE — Assessment & Plan Note (Signed)
Summary: f/u on lab work//lch   Vital Signs:  Patient profile:   55 year old male Weight:      192 pounds BMI:     24.74 Pulse rate:   60 / minute Resp:     14 per minute BP sitting:   130 / 84  (left arm) Cuff size:   large  Vitals Entered By: Shonna Chock CMA (July 04, 2010 8:09 AM) CC: Follow-up visit: discuss labs (copy given), Fatigue   Primary Care Provider:  Alfonse Flavors  CC:  Follow-up visit: discuss labs (copy given) and Fatigue.  History of Present Illness:    Timothy [name] is asymptomatic; he is following up on labs after increase in thyroid supplement. The patient denies  fatigue,  bowel  or weight change , palpitations,exertional chest pain, or dyspnea.  The patient denies the following symptoms: leg swelling, adenopathy, severe snoring, daytime sleepiness, temperature intolerance  and skin, hair or nail changes. TSH is therapeutic. He has ED symptoms & focus issues , but the Testosterone level is WNL.The focus issues may have  been related to a detox diet.  Current Medications (verified): 1)  Nasacort Aq   Aers (Triamcinolone Acetonide(Nasal) Aers) .Marland Kitchen.. 1-2 Sprays Each Nostril Once Daily As Needed 2)  Synthroid 150 Mcg  Tabs (Levothyroxine Sodium) .Marland Kitchen.. 1po Once Daily Except 1 and 1/2 Tabs On Tues, Weds & Thurs 3)  Cialis 20 Mg  Tabs (Tadalafil) .Marland Kitchen.. 1 By Mouth As Needed 4)  Folic Acid .... Once Daily 5)  Voltaren 1 % Gel (Diclofenac Sodium) .... 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 Psych:  Denies anxiety, depression, easily angered, easily tearful, and irritability. Endo:  Denies excessive hunger, excessive thirst, and excessive urination.  Physical Exam  General:  well-nourished;alert,appropriate and cooperative throughout examination Eyes:  No corneal or conjunctival inflammation noted.  Perrla. No lid lag Neck:  No deformities, masses, or tenderness noted. Lungs:  Normal respiratory effort, chest expands symmetrically. Lungs are clear to auscultation, no crackles or wheezes. Heart:  regular rhythm, no murmur, no gallop, no rub, no JVD, and bradycardia.   Neurologic:  alert & oriented X3 and DTRs symmetrical and normal.   No tremor Skin:  Intact without suspicious lesions or rashes Psych:  memory intact for recent and remote, normally interactive, and good eye contact.     Impression & Recommendations:  Problem # 1:  HYPOTHYROIDISM (ICD-244.9)  His updated medication list for this problem includes:    Synthroid 150 Mcg Tabs (Levothyroxine sodium) .Marland Kitchen... 1po once daily except 1 and 1/2 tabs on tues, weds & thurs  Problem # 2:  ATTENTION OR CONCENTRATION DEFICIT (ICD-799.51) forma testing declined  Problem # 3:  ERECTILE DYSFUNCTION (ICD-302.72) Normal Testosterone level His updated medication list for this problem includes:    Cialis 20 Mg Tabs (Tadalafil) .Marland Kitchen... 1 by mouth as needed  Complete Medication List: 1)  Nasacort Aq Aers (Triamcinolone acetonide(nasal) aers) .Marland Kitchen.. 1-2 sprays each nostril once daily as needed 2)  Synthroid 150 Mcg Tabs (Levothyroxine sodium) .Marland Kitchen.. 1po once daily except 1 and 1/2 tabs on tues, weds & thurs 3)  Cialis 20 Mg Tabs (Tadalafil) .Marland Kitchen.. 1 by mouth as needed 4)  Folic Acid   .... Once daily 5)  Voltaren 1 % Gel (Diclofenac sodium) .... Apply two times a day after  elbow exercises  Patient Instructions: 1)  Call if definitive testing desired for ADD Prescriptions: SYNTHROID 150 MCG  TABS (LEVOTHYROXINE SODIUM) 1po once daily except 1 and 1/2 tabs on tues, WEDS & thurs  #90 x 3   Entered and Authorized by:   Marga Melnick MD   Signed by:   Marga Melnick MD on 07/04/2010   Method used:   Print then Give to Patient   RxID:   [id number]    Orders Added: 1)  Est. Patient Level III [29528]

## 2010-07-11 NOTE — Progress Notes (Signed)
Summary: Labs  Phone Note Call from Patient Call back at Home Phone [phone]   Caller: Patient Summary of Call: Pt would like to speak with a nurse about some labs he would like to have done before his cpx in May. Initial call taken by: Lavell Islam,  June 17, 2010 9:43 AM  Follow-up for Phone Call        Patient wants his CPX labs prior to his appt and he was made aware that is fine.  Patient was adament that he wants his testosterone level checked also due to symptoms he is having and I made him aware that we would get order from MD. Please advise. Follow-up by: Lucious Groves CMA,  June 17, 2010 9:56 AM  Additional Follow-up for Phone Call Additional follow up Details #1::        Left message on machine informing patient we will glady add test, I need to know what symptoms he is having to attach an accurate diagnosis with lab order otherwise there is  a risk that insurance will not cover Additional Follow-up by: Shonna Chock CMA,  June 17, 2010 3:07 PM    Additional Follow-up for Phone Call Additional follow up Details #2::    Patient called to notify that he is having ED for awhile and it is worsening and he notes that Cialis is not very effective. He is also noticing a decrease in concentration. He would like to know how soon he can have this testing done, but can wait until May if he has to. Please advise. Lucious Groves CMA  June 18, 2010 3:23 PM   Additional Follow-up for Phone Call Additional follow up Details #3:: Details for Additional Follow-up Action Taken: Lab Orders: Testosterone level 607.84, lipid, hep, bmp,cbcd,tsh,psa, udip, stool cards V70.0/244.9/790.93  Pt has appt for lab work 06/21/09 for testosterone level and tsh. Pt said he was supposed to come back 10 weeks after his Synthroid was adjusted but he never made it back. Spoke with Tresa Endo and she ok'd for him to do the TSH with the Testosterone at Fridays lab visit. Kalyn Negrete  June 19, 2010 10:00  AM Additional Follow-up by: Shonna Chock CMA,  June 18, 2010 4:27 PM

## 2010-07-17 NOTE — Progress Notes (Signed)
Summary: lmom to schedule followup appt   1/23--made appt  ---- Converted from flag ---- ---- 06/30/2010 8:16 AM, Marga Melnick MD wrote: please verify F/U appt ------------------------------       Additional Follow-up for Phone Call Additional follow up Details #2::    lmom to call and schedule a followup appt  (patient does have labs and CPE in May).Marland KitchenMarland KitchenJerolyn Shin  July 01, 2010 1:31 PM  Additional Follow-up for Phone Call Additional follow up Details #3:: Details for Additional Follow-up Action Taken: Patient had followup appt on 1/26 Additional Follow-up by: Jerolyn Shin,  July 09, 2010 5:12 PM

## 2010-08-16 ENCOUNTER — Encounter: Payer: Self-pay | Admitting: Internal Medicine

## 2010-10-25 NOTE — Op Note (Signed)
[name], [name]                 ACCOUNT NO.:  [id number]   MEDICAL RECORD NO.:  [id number]          PATIENT TYPE:  AMB   LOCATION:  DSC                          FACILITY:  [facility]   PHYSICIAN:  Cindee Salt, M.D.       DATE OF BIRTH:  1955-06-23   DATE OF PROCEDURE:  05/19/2006  DATE OF DISCHARGE:                               OPERATIVE REPORT   PREOPERATIVE DIAGNOSIS:  Carpal tunnel syndrome left hand.   POSTOPERATIVE DIAGNOSIS:  Carpal tunnel syndrome left hand.   OPERATION:  Decompression left median nerve.   SURGEON:  Cindee Salt, M.D.   ASSISTANT:  Carolyne Fiscal R.N.   ANESTHESIA PERFORMED:  Based IV regional.   HISTORY:  The patient is a 55 year old male with a history of carpal  tunnel syndrome.  EMG nerve conduction is positive.  This has not  responded to conservative treatment.  He is admitted now for release.  He is aware of risks and complications; including infection, recurrence,  injury to arteries, nerves, tendons; incomplete relief of symptoms; and  dystrophy.  He has elected to proceed with the surgical intervention.  In the preoperative area the patient is seen; questions encouraged and  answered.  The extremity marked by both the patient and surgeon.   PROCEDURE:  The patient was brought to the operating room where a  forearm based IV regional anesthetic was carried out without difficulty.  He was prepped using DuraPrep in the supine position, left arm free.  After draping, a longitudinal incision was made in the palm, carried  down through subcutaneous tissue.  Bleeders were electrocauterized.  Palmar fascia was split, superficial palmar arch identified.  The flexor  tendon to the ring and little finger identified.  To the ulnar side of  median nerve the carpal retinaculum was incised with sharp dissection.  A right angle and Sewall retractor were placed between skin and forearm  fascia.  The fascia was released for approximately 1.5 cm proximal to  the wrist crease  under direct vision.  The canal was explored.  No  further lesions were identified.   The area of compression to the nerve was apparent.  The wound was  irrigated.  The skin was closed with interrupted 5-0 nylon sutures.  Sterile compressive dressing and splint was applied.  The patient  tolerated the procedure well; and was taken to the recovery room for  observation in satisfactory condition.  He is discharged home to return  to the [facility] [place] in 1 week on Vicodin.          ______________________________  Cindee Salt, M.D.    GK/MEDQ  D:  05/19/2006  T:  05/19/2006  Job:  [id number]   cc:   Titus Dubin. Alwyn Ren, MD,FACP,FCCP

## 2010-10-25 NOTE — Op Note (Signed)
NAMEJAHMEIR, Timothy [name]                 ACCOUNT NO.:  [id number]   MEDICAL RECORD NO.:  [id number]          PATIENT TYPE:  AMB   LOCATION:  DSC                          FACILITY:  [facility]   PHYSICIAN:  Cindee Salt, M.D.       DATE OF BIRTH:  [birth year]   DATE OF PROCEDURE:  10/01/2005  DATE OF DISCHARGE:                                 OPERATIVE REPORT   PREOPERATIVE DIAGNOSIS:  Carpal tunnel syndrome, right hand.   POSTOPERATIVE DIAGNOSIS:  Carpal tunnel syndrome, right hand.   OPERATION PERFORMED:  Decompression of right median nerve.   SURGEON:  Cindee Salt, M.D.   ANESTHESIA:  Forearm based IV regional.   INDICATIONS FOR PROCEDURE:  The patient is a 55 year old male with a history  of carpal tunnel syndrome, EMG nerve conductions positive, which has not  responded to conservative treatment.   DESCRIPTION OF PROCEDURE:  The patient was brought to the operating room  where forearm based IV regional anesthetic was carried out without  difficulty.  He was prepped using DuraPrep in supine position, right arm  free.  Longitudinal incision was made in the palm and carried down through  subcutaneous tissue.  Bleeders were electrocauterized.  Palmar fascia was  split.  Palmar arch was identified.  The flexor tendon in the ring finger on  right side were identified to the ulnar side of the median nerve.  Carpal  retinaculum was incised with sharp dissection.  Right angle and Sewell  retractor were placed between skin and forearm fascia.  Fascia was released  for approximately 1.5 cm proximal to the wrist crease under direct vision.  No further lesions were identified.  Wound was irrigated.  Skin was closed  with interrupted 5-0 nylon suture.  Area of compression of the nerve was  apparent.  Sterile compressive dressing and splint were applied.  The  patient tolerated the procedure well and was taken to the recovery room for  observation in satisfactory condition.  He is discharged to home to  return  to the [facility] [place] in one week on Vicodin.           ______________________________  Cindee Salt, M.D.     GK/MEDQ  D:  10/01/2005  T:  10/02/2005  Job:  [id number]

## 2010-11-01 ENCOUNTER — Other Ambulatory Visit: Payer: Self-pay

## 2010-11-06 ENCOUNTER — Encounter: Payer: Self-pay | Admitting: Internal Medicine

## 2010-11-10 ENCOUNTER — Other Ambulatory Visit: Payer: Self-pay | Admitting: Internal Medicine

## 2011-07-24 ENCOUNTER — Telehealth: Payer: Self-pay | Admitting: Internal Medicine

## 2011-07-24 MED ORDER — LEVOTHYROXINE SODIUM 150 MCG PO TABS: ORAL_TABLET | ORAL | Status: DC

## 2011-07-24 NOTE — Telephone Encounter (Signed)
Refill sent to pharmacy.   

## 2011-07-24 NOTE — Telephone Encounter (Signed)
Refill: Levothyroxine 150 mcg tablet. Take 1 tablet by mouth every day except take 1&1/2 tablets on Tuesday and Thursday. Qty 102. Last fill 04-20-11

## 2011-08-01 ENCOUNTER — Other Ambulatory Visit: Payer: Self-pay | Admitting: Internal Medicine

## 2011-08-01 NOTE — Telephone Encounter (Signed)
I called the pharmacy and he stated med was received on 07/24/11

## 2011-11-14 ENCOUNTER — Encounter: Payer: Self-pay | Admitting: Internal Medicine

## 2011-12-26 ENCOUNTER — Encounter: Payer: Self-pay | Admitting: Internal Medicine

## 2012-02-20 ENCOUNTER — Ambulatory Visit (INDEPENDENT_AMBULATORY_CARE_PROVIDER_SITE_OTHER): Payer: BC Managed Care – PPO | Admitting: Internal Medicine

## 2012-02-20 ENCOUNTER — Encounter: Payer: Self-pay | Admitting: Internal Medicine

## 2012-02-20 VITALS — BP 130/88 | HR 46 | Temp 97.7°F | Resp 12 | Ht 74.0 in | Wt 199.6 lb

## 2012-02-20 DIAGNOSIS — Z13220 Encounter for screening for lipoid disorders: Secondary | ICD-10-CM

## 2012-02-20 DIAGNOSIS — N529 Male erectile dysfunction, unspecified: Secondary | ICD-10-CM

## 2012-02-20 DIAGNOSIS — Z0000 Encounter for general adult medical examination without abnormal findings: Secondary | ICD-10-CM

## 2012-02-20 DIAGNOSIS — Z8249 Family history of ischemic heart disease and other diseases of the circulatory system: Secondary | ICD-10-CM | POA: Insufficient documentation

## 2012-02-20 DIAGNOSIS — N400 Enlarged prostate without lower urinary tract symptoms: Secondary | ICD-10-CM

## 2012-02-20 LAB — CBC WITH DIFFERENTIAL/PLATELET
Basophils Absolute: 0 10*3/uL (ref 0.0–0.1)
Hemoglobin: 14.1 g/dL (ref 13.0–17.0)
Lymphocytes Relative: 38.8 % (ref 12.0–46.0)
Monocytes Relative: 11.1 % (ref 3.0–12.0)
Neutrophils Relative %: 47.7 % (ref 43.0–77.0)
Platelets: 178 10*3/uL (ref 150.0–400.0)
RDW: 14 % (ref 11.5–14.6)

## 2012-02-20 LAB — LIPID PANEL
Total CHOL/HDL Ratio: 3
VLDL: 14.8 mg/dL (ref 0.0–40.0)

## 2012-02-20 LAB — BASIC METABOLIC PANEL
Calcium: 9.9 mg/dL (ref 8.4–10.5)
GFR: 62.14 mL/min (ref >60.00)
Glucose, Bld: 82 mg/dL (ref 70–99)
Sodium: 140 mEq/L (ref 135–145)

## 2012-02-20 LAB — LDL CHOLESTEROL, DIRECT: Direct LDL: 120.2 mg/dL

## 2012-02-20 LAB — HEPATIC FUNCTION PANEL
AST: 29 U/L (ref 0–37)
Alkaline Phosphatase: 34 U/L — ABNORMAL LOW (ref 39–117)
Bilirubin, Direct: 0.1 mg/dL (ref 0.0–0.3)
Total Bilirubin: 0.8 mg/dL (ref 0.3–1.2)

## 2012-02-20 LAB — TSH: TSH: 6.36 u[IU]/mL — ABNORMAL HIGH (ref 0.35–5.50)

## 2012-02-20 MED ORDER — TADALAFIL 5 MG PO TABS: 5.0000 mg | ORAL_TABLET | Freq: Every day | ORAL | Status: DC

## 2012-02-20 NOTE — Progress Notes (Signed)
  Subjective:    Patient ID: Timothy [name], male    DOB: [birth year], 56 y.o.   MRN: [id number]  HPI  Mr Haydel is here for a physical; he denies acute issues.      Review of Systems He exercises at a high cardiovascular level 5-7 times per week for 45-60 minutes each session. He's also on a  heart healthy diet. He denies chest pain, palpitations, shortness of breath, edema, or claudication.     Objective:   Physical Exam Gen.: Healthy, well muscled and well-nourished in appearance. Alert, appropriate and cooperative throughout exam. Head: Normocephalic without obvious abnormalities; no alopecia  Eyes: No corneal or conjunctival inflammation noted. Pupils equal round reactive to light and accommodation. Fundal exam is benign without hemorrhages, exudate, papilledema. Extraocular motion intact. Vision grossly normal. Ears: External  ear exam reveals no significant lesions or deformities. Canals clear .TMs normal. Hearing is grossly normal bilaterally. Nose: External nasal exam reveals no deformity or inflammation. Nasal mucosa are pink and moist. No lesions or exudates noted. Septum  Minimally dislocated  Mouth: Oral mucosa and oropharynx reveal no lesions or exudates. Teeth in good repair. Neck: No deformities, masses, or tenderness noted. Range of motion & Thyroid normal. Lungs: Normal respiratory effort; chest expands symmetrically. Lungs are clear to auscultation without rales, wheezes, or increased work of breathing. Heart: Normal rhythm; slow rate . Normal S1 and S2. No gallop, click, or rub. No murmur. Abdomen: Bowel sounds normal; abdomen soft and nontender. No masses, organomegaly or hernias noted. Genitalia/ DRE: Genitalia normal except for left varices.Prostate is 1.5-2+ enlarged w/o asymmetry, nodularity, or induration.  Musculoskeletal/extremities: No deformity or scoliosis noted of  the thoracic or lumbar spine. No clubbing, cyanosis, edema, or deformity noted. Range of motion   normal .Tone & strength  normal.Joints normal. Nail health  good. Vascular: Carotid, radial artery, dorsalis pedis and  posterior tibial pulses are full and equal. No bruits present. Neurologic: Alert and oriented x3. Deep tendon reflexes symmetrical and normal.         Skin: Intact without suspicious lesions or rashes. Deeply tanned; scattered multiple nevi without definite dysplasia or atypical appearance Lymph: No cervical, axillary, or inguinal lymphadenopathy present. Psych: Mood and affect are normal. Normally interactive                                                                                         Assessment & Plan:  #1 comprehensive physical exam; no acute findings #2 see Problem List with Assessments & Recommendations.  Daily Cialis 5 mg should address both the significant prostatic hypertrophy as well as the ED. He has a history of intermittently elevated PSA. Baseline PSA is recommended.  Note: He has a physiologic bradycardia due to  conditioning; minor early repolarization changes are present. The family history heart disease is related to smoking and is not premature Plan: see Orders

## 2012-02-20 NOTE — Patient Instructions (Addendum)
Preventive Health Care: Eat a low-fat diet with lots of fruits and vegetables, up to 7-9 servings per day.  Blood Pressure Goal  Ideally is an AVERAGE < 135/85. This AVERAGE should be calculated from @ least 5-7 BP readings taken @ different times of day on different days of week. You should not respond to isolated BP readings , but rather the AVERAGE for that week. EKG is normal but there are minor ST-T changes of early repolarization. These are normal variants but could be mistaken for acute injury. This EKG should be available for comparison if  seen emergently.   If you activate My Chart; the results can be released to you as soon as they populate from the lab. If you choose not to use this program; the labs have to be reviewed, copied & mailed causing a delay in getting the results to you.

## 2012-02-23 ENCOUNTER — Encounter: Payer: Self-pay | Admitting: Internal Medicine

## 2012-03-19 ENCOUNTER — Other Ambulatory Visit: Payer: Self-pay | Admitting: Internal Medicine

## 2012-03-19 NOTE — Telephone Encounter (Signed)
Pt called on nurses line and stated he needed a [facility]- he didn't leave any info just that, pls call pt.

## 2012-03-19 NOTE — Telephone Encounter (Signed)
Called Pt back, he stated that when he went to pharmacy to pick up his Cialis they only gave him 4 pills. Rx states he should take this med daily. Advised Pt to call insurance to see if a Prior Authorization is needed. Paperwork printed and waiting for Pt to advise.

## 2012-03-22 NOTE — Telephone Encounter (Signed)
Paperwork was completed by MD and faxed to [phone]. We will await response

## 2012-03-22 NOTE — Telephone Encounter (Signed)
Paper work, placed on ledge for MD to complete Prior Auth questions, then to be faxed back

## 2012-03-22 NOTE — Telephone Encounter (Signed)
Cialis 5 mg daily is the appropriate medicine; in addition the ED he has prostatic hypertrophy with an intermittently elevated PSA without evidence of prostate cancer.

## 2012-03-26 ENCOUNTER — Other Ambulatory Visit: Payer: Self-pay

## 2012-03-26 NOTE — Telephone Encounter (Signed)
Pt called in concerning Cialis refill request I notified pt that paper work was faxed in by [employer] on 03/22/12 and she is awaiting a response and didn't see any updates/ notes after that. Pt states he would like to be called back when [employer] and assistant return to update him has Rx been approved/ done.        MW

## 2012-03-26 NOTE — Telephone Encounter (Signed)
Lmovm for pt to call office.     Message from Dr. Alwyn Ren: He should check with [employer] to see if they did authorize the medication based on the document we completed & FAXed ; the reply form we received is unclear. If they did not authorize it;unfortunately he needs to come in so we can provide more documentation based on symptoms & signs.This is frustrating, but we are battling bureaucracy.

## 2012-03-29 NOTE — Telephone Encounter (Signed)
Left message on voicemail for patient with Dr.Hopper's response. Patient to call and schedule appointment if med not approved by insurance

## 2012-03-29 NOTE — Telephone Encounter (Signed)
See other phone note with duplicate information

## 2012-04-09 ENCOUNTER — Telehealth: Payer: Self-pay

## 2012-04-09 DIAGNOSIS — Z87898 Personal history of other specified conditions: Secondary | ICD-10-CM

## 2012-04-09 DIAGNOSIS — E039 Hypothyroidism, unspecified: Secondary | ICD-10-CM

## 2012-04-09 NOTE — Telephone Encounter (Signed)
Pt called left a message on triage line stating f/u on Cialis preauthorization with [employer]. Pt states he has gotten info from [facility] and assistant and request was done 1 1/2 weeks ago but hasn't gotten meds. Pt wants a call to see status.  PLz advise   MW EA:[phone]

## 2012-04-09 NOTE — Telephone Encounter (Signed)
Spoke with patient, per Dr.Hopper patient will need OV to further discuss. Medication was denied.

## 2012-05-21 ENCOUNTER — Other Ambulatory Visit: Payer: BC Managed Care – PPO

## 2012-05-26 ENCOUNTER — Other Ambulatory Visit (INDEPENDENT_AMBULATORY_CARE_PROVIDER_SITE_OTHER): Payer: BC Managed Care – PPO

## 2012-05-26 DIAGNOSIS — Z87898 Personal history of other specified conditions: Secondary | ICD-10-CM

## 2012-05-26 DIAGNOSIS — E039 Hypothyroidism, unspecified: Secondary | ICD-10-CM

## 2012-05-26 LAB — TSH: TSH: 1.04 u[IU]/mL (ref 0.35–5.50)

## 2012-05-26 LAB — PSA: PSA: 1.45 ng/mL (ref 0.10–4.00)

## 2012-05-28 ENCOUNTER — Encounter: Payer: Self-pay | Admitting: Internal Medicine

## 2012-05-28 ENCOUNTER — Ambulatory Visit (INDEPENDENT_AMBULATORY_CARE_PROVIDER_SITE_OTHER): Payer: BC Managed Care – PPO | Admitting: Internal Medicine

## 2012-05-28 VITALS — BP 110/68 | HR 41 | Wt 197.2 lb

## 2012-05-28 DIAGNOSIS — N529 Male erectile dysfunction, unspecified: Secondary | ICD-10-CM | POA: Insufficient documentation

## 2012-05-28 DIAGNOSIS — E039 Hypothyroidism, unspecified: Secondary | ICD-10-CM

## 2012-05-28 DIAGNOSIS — N401 Enlarged prostate with lower urinary tract symptoms: Secondary | ICD-10-CM

## 2012-05-28 DIAGNOSIS — N138 Other obstructive and reflux uropathy: Secondary | ICD-10-CM

## 2012-05-28 DIAGNOSIS — N400 Enlarged prostate without lower urinary tract symptoms: Secondary | ICD-10-CM

## 2012-05-28 MED ORDER — TADALAFIL 5 MG PO TABS: 5.0000 mg | ORAL_TABLET | Freq: Every day | ORAL | Status: DC

## 2012-05-28 NOTE — Assessment & Plan Note (Signed)
He is clinically asymptomatic; TSH was ideal. No change in thyroid dose. Recheck TSH in one year.

## 2012-05-28 NOTE — Patient Instructions (Addendum)
These data should serve as documentation for the need for low-dose daily Cialis to address symptomatic prostatic hypertrophy as well as the ED

## 2012-05-28 NOTE — Progress Notes (Signed)
  Subjective:    Patient ID: Timothy [name], male    DOB: 1956/03/16, 56 y.o.   MRN: [id number]  HPI  He returns for evaluation of thyroid function and prostate issues.  The thyroid supplementation was increased in September; the TSH has dropped from a hypothyroid level of 6.36 to a therapeutic level of 1.04.  In September his PSA was 1.76; of concern was a rise from 0.94 in January 2012. In 2010 he had a significantly elevated PSA of 6.23.  On exam he does have significant prostatic hypertrophy. He also has some erectile dysfunction.  He notes some difficulty starting stream and some dribbling. He has no difficulty with terminating straining.    Review of Systems  Constitutional: Weight change: no; Fatigue:no; Sleep pattern:good; Appetite:good  Visual change(blurred/diplopia/visual loss):no Hoarseness:no; Swallowing issues:no Cardiovascular: Palpitations:no; Racing:no; Irregularity:no GI: Constipation:no; Diarrhea:no Derm: Change in nails/hair/skin:no Neuro: Numbness/tingling:no; Tremor:no Psych: Anxiety:no Endo: Temperature intolerance: Heat:no; Cold:no       Objective:   Physical Exam  Gen.: Fit & well-nourished; in no acute distress Eyes: Extraocular motion intact; no lid lag or proptosis Neck:thyroid normal Heart: Normal rhythm and slow  rate without significant murmur, gallop, or extra heart sounds Lungs: Chest clear to auscultation without rales,rales, wheezes Neuro:Deep tendon reflexes are equal and within normal limits; no tremor  Skin: Warm and dry without significant lesions or rashes; no onycholysis Psych: Normally communicative and interactive; no abnormal mood or affect clinically.         Assessment & Plan:

## 2012-05-28 NOTE — Assessment & Plan Note (Signed)
He has symptomatic BPH. The low dose daily Cialis offers some benefit possibly addressing  the prostatic hypertrophy and its associated mild obstructive symptoms as well as the ED.

## 2012-06-14 ENCOUNTER — Encounter: Payer: Self-pay | Admitting: Internal Medicine

## 2012-07-02 ENCOUNTER — Other Ambulatory Visit: Payer: Self-pay | Admitting: Internal Medicine

## 2012-07-02 DIAGNOSIS — N400 Enlarged prostate without lower urinary tract symptoms: Secondary | ICD-10-CM

## 2012-07-02 DIAGNOSIS — E039 Hypothyroidism, unspecified: Secondary | ICD-10-CM

## 2012-07-02 DIAGNOSIS — N529 Male erectile dysfunction, unspecified: Secondary | ICD-10-CM

## 2012-07-02 MED ORDER — TADALAFIL 5 MG PO TABS: 5.0000 mg | ORAL_TABLET | Freq: Every day | ORAL | Status: DC

## 2012-07-02 NOTE — Telephone Encounter (Signed)
Refills for [facility] and [facility] [address]

## 2012-07-08 ENCOUNTER — Encounter: Payer: Self-pay | Admitting: Internal Medicine

## 2012-07-09 ENCOUNTER — Encounter: Payer: Self-pay | Admitting: Internal Medicine

## 2012-07-24 ENCOUNTER — Other Ambulatory Visit: Payer: Self-pay

## 2012-11-16 ENCOUNTER — Other Ambulatory Visit: Payer: Self-pay | Admitting: Internal Medicine

## 2012-11-16 ENCOUNTER — Encounter: Payer: Self-pay | Admitting: Internal Medicine

## 2012-11-16 DIAGNOSIS — N529 Male erectile dysfunction, unspecified: Secondary | ICD-10-CM

## 2012-11-16 DIAGNOSIS — N400 Enlarged prostate without lower urinary tract symptoms: Secondary | ICD-10-CM

## 2012-11-16 MED ORDER — TADALAFIL 5 MG PO TABS: 5.0000 mg | ORAL_TABLET | Freq: Every day | ORAL | Status: DC

## 2012-11-16 NOTE — Telephone Encounter (Signed)
Hopp please advise if ok to give rx as previously dispensed #30 with 11 refills

## 2012-11-16 NOTE — Telephone Encounter (Signed)
I wrote for #90 if mail order to [place]

## 2012-11-17 ENCOUNTER — Encounter: Payer: Self-pay | Admitting: Internal Medicine

## 2013-02-22 ENCOUNTER — Other Ambulatory Visit: Payer: Self-pay | Admitting: Internal Medicine

## 2013-04-14 ENCOUNTER — Other Ambulatory Visit: Payer: Self-pay

## 2013-05-19 ENCOUNTER — Other Ambulatory Visit: Payer: Self-pay | Admitting: Internal Medicine

## 2013-08-05 ENCOUNTER — Other Ambulatory Visit: Payer: Self-pay | Admitting: Internal Medicine

## 2013-08-09 ENCOUNTER — Telehealth: Payer: Self-pay | Admitting: *Deleted

## 2013-08-09 NOTE — Telephone Encounter (Signed)
Pharmacy is phoning requesting refills for patient's meds-has not been seen since 05/2012.  Per previous documentation, patient has to come in for OV & labs before any more refills.  Phoned & spoke with pharmacist and relayed info.

## 2013-08-09 NOTE — Telephone Encounter (Signed)
Patient has phoned again requesting refill for synthroid-has OV with PCP 08/16/13.  Med already sent to pharmacy. Phoned and notified patient.

## 2013-08-09 NOTE — Telephone Encounter (Signed)
Rx sent to the pharmacy by e-script.  Pt needs complete physical and fasting labs.//AB/CMA 

## 2013-08-16 ENCOUNTER — Encounter: Payer: Self-pay | Admitting: Internal Medicine

## 2013-08-16 ENCOUNTER — Other Ambulatory Visit: Payer: Self-pay | Admitting: Internal Medicine

## 2013-08-16 ENCOUNTER — Ambulatory Visit (INDEPENDENT_AMBULATORY_CARE_PROVIDER_SITE_OTHER): Payer: BC Managed Care – PPO | Admitting: Internal Medicine

## 2013-08-16 VITALS — BP 108/68 | HR 45 | Temp 97.9°F | Ht 73.0 in | Wt 193.2 lb

## 2013-08-16 DIAGNOSIS — R972 Elevated prostate specific antigen [PSA]: Secondary | ICD-10-CM

## 2013-08-16 DIAGNOSIS — N400 Enlarged prostate without lower urinary tract symptoms: Secondary | ICD-10-CM

## 2013-08-16 DIAGNOSIS — E039 Hypothyroidism, unspecified: Secondary | ICD-10-CM

## 2013-08-16 DIAGNOSIS — Z0000 Encounter for general adult medical examination without abnormal findings: Secondary | ICD-10-CM

## 2013-08-16 DIAGNOSIS — N529 Male erectile dysfunction, unspecified: Secondary | ICD-10-CM

## 2013-08-16 MED ORDER — LEVOTHYROXINE SODIUM 150 MCG PO TABS: ORAL_TABLET | ORAL | Status: DC

## 2013-08-16 MED ORDER — TADALAFIL 5 MG PO TABS: 5.0000 mg | ORAL_TABLET | Freq: Every day | ORAL | Status: DC

## 2013-08-16 NOTE — Progress Notes (Signed)
Pre visit review using our clinic review tool, if applicable. No additional management support is needed unless otherwise documented below in the visit note. 

## 2013-08-16 NOTE — Patient Instructions (Signed)
Your next office appointment will be determined based upon review of your pending labs. Those instructions will be transmitted to you through My Chart . 

## 2013-08-16 NOTE — Progress Notes (Signed)
   Subjective:    Patient ID: Timothy [name], male    DOB: 1955-08-10, 58 y.o.   MRN: [id number]  HPI  He is here for a physical;acute issues include sciatica for which he sees a Chiropractor with benefit.     Review of Systems  There has been no change in the dose, brand ,ormode of administration of thyroid supplement Constitutional: No significant change in weight; significant fatigue; sleep disorder; change in appetite. Eye: no blurred, double ,loss of vision Cardiovascular: no palpitations; racing; irregularity ENT/GI: no constipation; diarrhea;hoarseness;dysphagia Derm: no change in nails,hair,skin Neuro: no numbness or tingling; tremor Psych:no anxiety; depression; panic attacks Endo: no temperature intolerance to heat ,cold     Objective:   Physical Exam Gen.: Thin but healthy and well-nourished in appearance. Alert, appropriate and cooperative throughout exam. Appears younger than stated age  Head: Normocephalic without obvious abnormalities Eyes: No corneal or conjunctival inflammation noted. Pupils equal round reactive to light and accommodation. Extraocular motion intact.  Ears: External  ear exam reveals no significant lesions or deformities. Canals clear .TMs normal. Nose: External nasal exam reveals no deformity or inflammation. Nasal mucosa are pink and moist. No lesions or exudates noted.   Mouth: Oral mucosa and oropharynx reveal no lesions or exudates. Teeth in good repair. Neck: No deformities, masses, or tenderness noted. Range of motion &Thyroid normal. Lungs: Normal respiratory effort; chest expands symmetrically. Lungs are clear to auscultation without rales, wheezes, or increased work of breathing. Heart: Normal rate and rhythm. Accentuated S1 ; normal S2. No gallop, click, or rub. No murmur. Abdomen: Bowel sounds normal; abdomen soft and nontender. No masses, organomegaly or hernias noted. Genitalia: Genitalia normal except for left varices. Prostate is 1.5-2 +  enlarged w/o asymmetry, nodularity, or induration                                   Musculoskeletal/extremities: No deformity or scoliosis noted of  the thoracic or lumbar spine.   No clubbing, cyanosis, edema, or significant extremity  deformity noted. Range of motion normal .Tone & strength normal. Hand joints normal Fingernail  health good. Able to lie down & sit up w/o help. Negative SLR bilaterally Vascular: Carotid, radial artery, dorsalis pedis and  posterior tibial pulses are full and equal. No bruits present. Neurologic: Alert and oriented x3. Deep tendon reflexes symmetrical and normal.  Gait normal        Skin: Intact without suspicious lesions or rashes. Lymph: No cervical, axillary, or inguinal lymphadenopathy present. Psych: Mood and affect are normal. Normally interactive                                                                                       Assessment & Plan:  #1 comprehensive physical exam; no acute findings  Plan: see Orders  & Recommendations

## 2013-08-17 ENCOUNTER — Other Ambulatory Visit (INDEPENDENT_AMBULATORY_CARE_PROVIDER_SITE_OTHER): Payer: BC Managed Care – PPO

## 2013-08-17 ENCOUNTER — Other Ambulatory Visit: Payer: BC Managed Care – PPO

## 2013-08-17 DIAGNOSIS — Z0000 Encounter for general adult medical examination without abnormal findings: Secondary | ICD-10-CM

## 2013-08-17 LAB — LIPID PANEL
CHOLESTEROL: 189 mg/dL (ref 0–200)
HDL: 83.9 mg/dL (ref >39.00)
LDL CALC: 98 mg/dL (ref 0–99)
Total CHOL/HDL Ratio: 2
Triglycerides: 38 mg/dL (ref 0.0–149.0)
VLDL: 7.6 mg/dL (ref 0.0–40.0)

## 2013-08-17 LAB — TSH: TSH: 4.14 u[IU]/mL (ref 0.35–5.50)

## 2013-08-17 LAB — CBC WITH DIFFERENTIAL/PLATELET
BASOS ABS: 0 10*3/uL (ref 0.0–0.1)
Basophils Relative: 0.4 % (ref 0.0–3.0)
EOS ABS: 0.3 10*3/uL (ref 0.0–0.7)
Eosinophils Relative: 5.2 % — ABNORMAL HIGH (ref 0.0–5.0)
HEMATOCRIT: 43.7 % (ref 39.0–52.0)
Hemoglobin: 14.9 g/dL (ref 13.0–17.0)
LYMPHS ABS: 2.2 10*3/uL (ref 0.7–4.0)
LYMPHS PCT: 40.4 % (ref 12.0–46.0)
MCHC: 34 g/dL (ref 30.0–36.0)
MCV: 93.9 fl (ref 78.0–100.0)
MONOS PCT: 11.6 % (ref 3.0–12.0)
Monocytes Absolute: 0.6 10*3/uL (ref 0.1–1.0)
Neutro Abs: 2.3 10*3/uL (ref 1.4–7.7)
Neutrophils Relative %: 42.4 % — ABNORMAL LOW (ref 43.0–77.0)
PLATELETS: 225 10*3/uL (ref 150.0–400.0)
RBC: 4.65 Mil/uL (ref 4.22–5.81)
RDW: 13.4 % (ref 11.5–14.6)
WBC: 5.4 10*3/uL (ref 4.5–10.5)

## 2013-08-17 LAB — HEPATIC FUNCTION PANEL
ALT: 25 U/L (ref 0–53)
AST: 35 U/L (ref 0–37)
Albumin: 4.5 g/dL (ref 3.5–5.2)
Alkaline Phosphatase: 35 U/L — ABNORMAL LOW (ref 39–117)
BILIRUBIN TOTAL: 0.9 mg/dL (ref 0.3–1.2)
Bilirubin, Direct: 0.1 mg/dL (ref 0.0–0.3)
Total Protein: 7.1 g/dL (ref 6.0–8.3)

## 2013-08-17 LAB — BASIC METABOLIC PANEL
BUN: 19 mg/dL (ref 6–23)
CALCIUM: 10 mg/dL (ref 8.4–10.5)
CO2: 27 mEq/L (ref 19–32)
Chloride: 105 mEq/L (ref 96–112)
Creatinine, Ser: 1.2 mg/dL (ref 0.4–1.5)
GFR: 65.32 mL/min (ref >60.00)
GLUCOSE: 92 mg/dL (ref 70–99)
Potassium: 5.3 mEq/L — ABNORMAL HIGH (ref 3.5–5.1)
Sodium: 139 mEq/L (ref 135–145)

## 2013-08-17 LAB — PSA: PSA: 1.27 ng/mL (ref 0.10–4.00)

## 2013-08-18 ENCOUNTER — Other Ambulatory Visit: Payer: Self-pay | Admitting: Internal Medicine

## 2013-08-18 ENCOUNTER — Encounter: Payer: Self-pay | Admitting: Internal Medicine

## 2013-08-18 DIAGNOSIS — E039 Hypothyroidism, unspecified: Secondary | ICD-10-CM

## 2013-08-18 MED ORDER — LEVOTHYROXINE SODIUM 150 MCG PO TABS: ORAL_TABLET | ORAL | Status: DC

## 2013-11-09 ENCOUNTER — Encounter: Payer: Self-pay | Admitting: Internal Medicine

## 2013-11-10 MED ORDER — TRIAMCINOLONE ACETONIDE 55 MCG/ACT NA AERO: 2.0000 | INHALATION_SPRAY | Freq: Every day | NASAL | Status: DC

## 2014-06-29 ENCOUNTER — Other Ambulatory Visit: Payer: Self-pay

## 2014-06-29 MED ORDER — LEVOTHYROXINE SODIUM 150 MCG PO TABS: ORAL_TABLET | ORAL | Status: DC

## 2014-08-03 ENCOUNTER — Other Ambulatory Visit: Payer: Self-pay

## 2014-08-03 MED ORDER — LEVOTHYROXINE SODIUM 150 MCG PO TABS: ORAL_TABLET | ORAL | Status: DC

## 2014-08-08 HISTORY — PX: BASAL CELL CARCINOMA EXCISION: SHX1214

## 2014-09-08 ENCOUNTER — Ambulatory Visit (INDEPENDENT_AMBULATORY_CARE_PROVIDER_SITE_OTHER): Payer: Managed Care, Other (non HMO) | Admitting: Internal Medicine

## 2014-09-08 ENCOUNTER — Other Ambulatory Visit (INDEPENDENT_AMBULATORY_CARE_PROVIDER_SITE_OTHER): Payer: Managed Care, Other (non HMO)

## 2014-09-08 ENCOUNTER — Encounter: Payer: Self-pay | Admitting: Internal Medicine

## 2014-09-08 VITALS — BP 124/88 | HR 47 | Temp 97.9°F | Resp 12 | Ht 73.0 in | Wt 194.5 lb

## 2014-09-08 DIAGNOSIS — N400 Enlarged prostate without lower urinary tract symptoms: Secondary | ICD-10-CM

## 2014-09-08 DIAGNOSIS — E038 Other specified hypothyroidism: Secondary | ICD-10-CM | POA: Diagnosis not present

## 2014-09-08 DIAGNOSIS — Z0000 Encounter for general adult medical examination without abnormal findings: Secondary | ICD-10-CM | POA: Diagnosis not present

## 2014-09-08 DIAGNOSIS — Z85828 Personal history of other malignant neoplasm of skin: Secondary | ICD-10-CM | POA: Insufficient documentation

## 2014-09-08 DIAGNOSIS — Z0189 Encounter for other specified special examinations: Secondary | ICD-10-CM | POA: Diagnosis not present

## 2014-09-08 LAB — TSH: TSH: 4.93 u[IU]/mL — ABNORMAL HIGH (ref 0.35–4.50)

## 2014-09-08 LAB — LIPID PANEL
Cholesterol: 192 mg/dL (ref 0–200)
HDL: 78.6 mg/dL (ref >39.00)
LDL CALC: 104 mg/dL — AB (ref 0–99)
NONHDL: 113.4
Total CHOL/HDL Ratio: 2
Triglycerides: 47 mg/dL (ref 0.0–149.0)
VLDL: 9.4 mg/dL (ref 0.0–40.0)

## 2014-09-08 LAB — PSA: PSA: 1.07 ng/mL (ref 0.10–4.00)

## 2014-09-08 LAB — HEPATIC FUNCTION PANEL
ALT: 19 U/L (ref 0–53)
AST: 25 U/L (ref 0–37)
Albumin: 4.4 g/dL (ref 3.5–5.2)
Alkaline Phosphatase: 36 U/L — ABNORMAL LOW (ref 39–117)
BILIRUBIN DIRECT: 0.2 mg/dL (ref 0.0–0.3)
TOTAL PROTEIN: 6.8 g/dL (ref 6.0–8.3)
Total Bilirubin: 0.7 mg/dL (ref 0.2–1.2)

## 2014-09-08 LAB — BASIC METABOLIC PANEL
BUN: 18 mg/dL (ref 6–23)
CO2: 31 mEq/L (ref 19–32)
Calcium: 10 mg/dL (ref 8.4–10.5)
Chloride: 105 mEq/L (ref 96–112)
Creatinine, Ser: 1.32 mg/dL (ref 0.40–1.50)
GFR: 59.42 mL/min — AB (ref >60.00)
Glucose, Bld: 82 mg/dL (ref 70–99)
Potassium: 5 mEq/L (ref 3.5–5.1)
SODIUM: 139 meq/L (ref 135–145)

## 2014-09-08 LAB — CBC WITH DIFFERENTIAL/PLATELET
BASOS PCT: 0.4 % (ref 0.0–3.0)
Basophils Absolute: 0 10*3/uL (ref 0.0–0.1)
Eosinophils Absolute: 0.2 10*3/uL (ref 0.0–0.7)
Eosinophils Relative: 3.8 % (ref 0.0–5.0)
HEMATOCRIT: 42.8 % (ref 39.0–52.0)
Hemoglobin: 14.7 g/dL (ref 13.0–17.0)
LYMPHS PCT: 39.8 % (ref 12.0–46.0)
Lymphs Abs: 2 10*3/uL (ref 0.7–4.0)
MCHC: 34.2 g/dL (ref 30.0–36.0)
MCV: 92.1 fl (ref 78.0–100.0)
MONO ABS: 0.6 10*3/uL (ref 0.1–1.0)
Monocytes Relative: 12.2 % — ABNORMAL HIGH (ref 3.0–12.0)
Neutro Abs: 2.2 10*3/uL (ref 1.4–7.7)
Neutrophils Relative %: 43.8 % (ref 43.0–77.0)
Platelets: 209 10*3/uL (ref 150.0–400.0)
RBC: 4.65 Mil/uL (ref 4.22–5.81)
RDW: 13.8 % (ref 11.5–15.5)
WBC: 5 10*3/uL (ref 4.0–10.5)

## 2014-09-08 MED ORDER — TADALAFIL 5 MG PO TABS: 5.0000 mg | ORAL_TABLET | Freq: Every day | ORAL | Status: DC

## 2014-09-08 MED ORDER — LEVOTHYROXINE SODIUM 150 MCG PO TABS: ORAL_TABLET | ORAL | Status: DC

## 2014-09-08 NOTE — Progress Notes (Signed)
Subjective:    Patient ID: Timothy [name], male    DOB: [birth year], 59 y.o.   MRN: [id number]  HPI  He is here for a physical;acute issues denied.  He is on a heart healthy diet; he exercises 5-7 times a week for 60 minutes as cardio/weight training. He has no associated cardiopulmonary symptoms with this.  He has been compliant with thyroid medicationwithout adverse effects. There is a strong family history of thyroid disorder in his family. Specifically both parents and 3 brothers have had thyroid disease. The family is originally from the [facility] area.  Both parents had heart attacks but were smokers. His lipids have been excellent.  Colonoscopy is current. Both parents had colon polyps. There is no family history of colon cancer. He has no active GI symptoms.  He has had prostatic hypertrophy. He does have occasional hesitancy but no other genitourinary symptoms.       Review of Systems Pertinent negative or absent signs and symptoms are as follows: Constitutional: No significant change in weight; significant fatigue; sleep disorder; change in appetite. Eye: no blurred, double ,loss of vision Cardiovascular: no palpitations; racing; irregularity;chest pain;PNDyspnea;edema ENT/GI: no constipation; diarrhea;hoarseness;dysphagia Derm: no change in nails,hair,skin Neuro: no numbness or tingling; tremor Psych:no anxiety; depression; panic attacks  Endo: no temperature intolerance to heat ,cold     Objective:   Physical Exam  Gen.: Adequately nourished in appearance. Alert, appropriate and cooperative throughout exam. BMI: 25.67 Appears younger than stated age  Head: Normocephalic without obvious abnormalities  Eyes: No corneal or conjunctival inflammation noted. Pupils equal round reactive to light and accommodation. Extraocular motion intact.  Ears: External  ear exam reveals no significant lesions or deformities. Canals clear .TMs normal. Hearing is grossly normal  bilaterally. Nose: External nasal exam reveals no deformity or inflammation. Nasal mucosa are pink and moist. No lesions or exudates noted.   Mouth: Oral mucosa and oropharynx reveal no lesions or exudates. Teeth in good repair. Neck: No deformities, masses, or tenderness noted. Range of motion & Thyroid normal. Lungs: Normal respiratory effort; chest expands symmetrically. Lungs are clear to auscultation without rales, wheezes, or increased work of breathing. Heart: Slow rate and regular rhythm. Normal S1 and S2. No gallop, click, or rub. No murmur. Abdomen: Bowel sounds normal; abdomen soft and nontender. No masses, organomegaly or hernias noted. Genitalia: Genitalia normal except for left varices. Prostate is  1.5+enlarged w/o asymmetry, nodularity, or induration               Musculoskeletal/extremities: No deformity or scoliosis noted of  the thoracic or lumbar spine.  No clubbing, cyanosis, edema, or significant extremity  deformity noted.  Range of motion normal . Tone & strength normal. Hand joints normal  Fingernail  health good. Minor crepitus of knees  Able to lie down & sit up w/o help.  Negative SLR bilaterally Vascular: Carotid, radial artery, dorsalis pedis and  posterior tibial pulses are full and equal. No bruits present. Neurologic: Alert and oriented x3. Deep tendon reflexes symmetrical and normal.  Gait normal     Skin: Intact without suspicious lesions or rashes. Lymph: No cervical, axillary, or inguinal lymphadenopathy present. Psych: Mood and affect are normal. Normally interactive  Assessment & Plan:  #1 comprehensive physical exam; no acute findings  Plan: see Orders  & Recommendations

## 2014-09-08 NOTE — Progress Notes (Signed)
Pre visit review using our clinic review tool, if applicable. No additional management support is needed unless otherwise documented below in the visit note. 

## 2014-09-08 NOTE — Patient Instructions (Signed)
  Your next office appointment will be determined based upon review of your pending labs.  Those instructions will be transmitted to you by My Chart    Critical results will be called.   Followup as needed for any active or acute issue. Please report any significant change in your symptoms. 

## 2014-09-09 ENCOUNTER — Other Ambulatory Visit: Payer: Self-pay | Admitting: Internal Medicine

## 2014-09-09 DIAGNOSIS — E038 Other specified hypothyroidism: Secondary | ICD-10-CM

## 2015-01-12 ENCOUNTER — Encounter: Payer: Self-pay | Admitting: Gastroenterology

## 2015-07-31 ENCOUNTER — Telehealth: Payer: Self-pay | Admitting: General Practice

## 2015-07-31 ENCOUNTER — Other Ambulatory Visit: Payer: Self-pay | Admitting: General Practice

## 2015-07-31 MED ORDER — LEVOTHYROXINE SODIUM 150 MCG PO TABS: ORAL_TABLET | ORAL | Status: DC

## 2015-07-31 NOTE — Telephone Encounter (Signed)
Left message for patient to return call to establish care with a new PCP.  Unable to re-fill request for Levothyroxine appointment is made.

## 2015-07-31 NOTE — Telephone Encounter (Signed)
Patient called and made an appt for 10/03/2015. Please fill RX in the meantime to last until the appt date

## 2015-07-31 NOTE — Telephone Encounter (Signed)
Levothyroxine filled for 60 days.

## 2015-10-03 ENCOUNTER — Encounter: Payer: Managed Care, Other (non HMO) | Admitting: Internal Medicine

## 2015-10-11 ENCOUNTER — Encounter: Payer: Self-pay | Admitting: Internal Medicine

## 2015-10-11 ENCOUNTER — Other Ambulatory Visit: Payer: Self-pay | Admitting: Internal Medicine

## 2015-10-11 ENCOUNTER — Ambulatory Visit (INDEPENDENT_AMBULATORY_CARE_PROVIDER_SITE_OTHER): Payer: Managed Care, Other (non HMO) | Admitting: Internal Medicine

## 2015-10-11 ENCOUNTER — Other Ambulatory Visit (INDEPENDENT_AMBULATORY_CARE_PROVIDER_SITE_OTHER): Payer: Managed Care, Other (non HMO)

## 2015-10-11 VITALS — BP 122/76 | HR 48 | Temp 97.8°F | Resp 16 | Ht 73.0 in | Wt 195.0 lb

## 2015-10-11 DIAGNOSIS — Z114 Encounter for screening for human immunodeficiency virus [HIV]: Secondary | ICD-10-CM

## 2015-10-11 DIAGNOSIS — Z1211 Encounter for screening for malignant neoplasm of colon: Secondary | ICD-10-CM | POA: Diagnosis not present

## 2015-10-11 DIAGNOSIS — Z1159 Encounter for screening for other viral diseases: Secondary | ICD-10-CM

## 2015-10-11 DIAGNOSIS — E038 Other specified hypothyroidism: Secondary | ICD-10-CM

## 2015-10-11 DIAGNOSIS — N400 Enlarged prostate without lower urinary tract symptoms: Secondary | ICD-10-CM

## 2015-10-11 DIAGNOSIS — N529 Male erectile dysfunction, unspecified: Secondary | ICD-10-CM

## 2015-10-11 DIAGNOSIS — Z23 Encounter for immunization: Secondary | ICD-10-CM | POA: Diagnosis not present

## 2015-10-11 DIAGNOSIS — Z0000 Encounter for general adult medical examination without abnormal findings: Secondary | ICD-10-CM

## 2015-10-11 DIAGNOSIS — M5432 Sciatica, left side: Secondary | ICD-10-CM | POA: Insufficient documentation

## 2015-10-11 LAB — CBC WITH DIFFERENTIAL/PLATELET
BASOS ABS: 0 10*3/uL (ref 0.0–0.1)
Basophils Relative: 0.4 % (ref 0.0–3.0)
EOS ABS: 0.2 10*3/uL (ref 0.0–0.7)
Eosinophils Relative: 3.7 % (ref 0.0–5.0)
HEMATOCRIT: 42.9 % (ref 39.0–52.0)
HEMOGLOBIN: 14.7 g/dL (ref 13.0–17.0)
LYMPHS PCT: 42.2 % (ref 12.0–46.0)
Lymphs Abs: 1.9 10*3/uL (ref 0.7–4.0)
MCHC: 34.3 g/dL (ref 30.0–36.0)
MCV: 92.3 fl (ref 78.0–100.0)
Monocytes Absolute: 0.6 10*3/uL (ref 0.1–1.0)
Monocytes Relative: 12.5 % — ABNORMAL HIGH (ref 3.0–12.0)
Neutro Abs: 1.9 10*3/uL (ref 1.4–7.7)
Neutrophils Relative %: 41.2 % — ABNORMAL LOW (ref 43.0–77.0)
PLATELETS: 240 10*3/uL (ref 150.0–400.0)
RBC: 4.65 Mil/uL (ref 4.22–5.81)
RDW: 14.1 % (ref 11.5–15.5)
WBC: 4.6 10*3/uL (ref 4.0–10.5)

## 2015-10-11 LAB — LIPID PANEL
CHOL/HDL RATIO: 2
Cholesterol: 178 mg/dL (ref 0–200)
HDL: 80.4 mg/dL (ref >39.00)
LDL CALC: 85 mg/dL (ref 0–99)
NonHDL: 97.73
TRIGLYCERIDES: 62 mg/dL (ref 0.0–149.0)
VLDL: 12.4 mg/dL (ref 0.0–40.0)

## 2015-10-11 LAB — COMPREHENSIVE METABOLIC PANEL
ALBUMIN: 4.3 g/dL (ref 3.5–5.2)
ALK PHOS: 39 U/L (ref 39–117)
ALT: 21 U/L (ref 0–53)
AST: 21 U/L (ref 0–37)
BILIRUBIN TOTAL: 0.6 mg/dL (ref 0.2–1.2)
BUN: 13 mg/dL (ref 6–23)
CALCIUM: 9.8 mg/dL (ref 8.4–10.5)
CO2: 28 mEq/L (ref 19–32)
CREATININE: 1.36 mg/dL (ref 0.40–1.50)
Chloride: 104 mEq/L (ref 96–112)
GFR: 57.18 mL/min — ABNORMAL LOW (ref >60.00)
Glucose, Bld: 97 mg/dL (ref 70–99)
Potassium: 4.4 mEq/L (ref 3.5–5.1)
Sodium: 140 mEq/L (ref 135–145)
Total Protein: 7.2 g/dL (ref 6.0–8.3)

## 2015-10-11 LAB — TSH: TSH: 0.78 u[IU]/mL (ref 0.35–4.50)

## 2015-10-11 MED ORDER — PREDNISONE 20 MG PO TABS: 40.0000 mg | ORAL_TABLET | Freq: Every day | ORAL | Status: DC

## 2015-10-11 MED ORDER — LEVOTHYROXINE SODIUM 150 MCG PO TABS: ORAL_TABLET | ORAL | Status: DC

## 2015-10-11 MED ORDER — TADALAFIL 5 MG PO TABS: 5.0000 mg | ORAL_TABLET | Freq: Every day | ORAL | Status: DC

## 2015-10-11 NOTE — Assessment & Plan Note (Signed)
Taking cialis 5 mg daily which works well Continue above

## 2015-10-11 NOTE — Patient Instructions (Addendum)
Test(s) ordered today. Your results will be released to [facility] (or called to you) after review, usually within 72hours after test completion. If any changes need to be made, you will be notified at that same time.  All other Health Maintenance issues reviewed.   All recommended immunizations and age-appropriate screenings are up-to-date or discussed.  Tetanus vaccine administered today.   Medications reviewed and updated.  Changes include a few days of prednisone for your back.  Your prescription(s) have been submitted to your pharmacy. Please take as directed and contact our office if you believe you are having problem(s) with the medication(s).  A referral for GI was ordered for your colonoscopy.    Please followup in one year  Health Maintenance, Male A healthy lifestyle and preventative care can promote health and wellness.  Maintain regular health, dental, and eye exams.  Eat a healthy diet. Foods like vegetables, fruits, whole grains, low-fat dairy products, and lean protein foods contain the nutrients you need and are low in calories. Decrease your intake of foods high in solid fats, added sugars, and salt. Get information about a proper diet from your health care provider, if necessary.  Regular physical exercise is one of the most important things you can do for your health. Most adults should get at least 150 minutes of moderate-intensity exercise (any activity that increases your heart rate and causes you to sweat) each week. In addition, most adults need muscle-strengthening exercises on 2 or more days a week.   Maintain a healthy weight. The body mass index (BMI) is a screening tool to identify possible weight problems. It provides an estimate of body fat based on height and weight. Your health care provider can find your BMI and can help you achieve or maintain a healthy weight. For males 20 years and older:  A BMI below 18.5 is considered underweight.  A BMI of 18.5 to  24.9 is normal.  A BMI of 25 to 29.9 is considered overweight.  A BMI of 30 and above is considered obese.  Maintain normal blood lipids and cholesterol by exercising and minimizing your intake of saturated fat. Eat a balanced diet with plenty of fruits and vegetables. Blood tests for lipids and cholesterol should begin at age 49 and be repeated every 5 years. If your lipid or cholesterol levels are high, you are over age 9, or you are at high risk for heart disease, you may need your cholesterol levels checked more frequently.Ongoing high lipid and cholesterol levels should be treated with medicines if diet and exercise are not working.  If you smoke, find out from your health care provider how to quit. If you do not use tobacco, do not start.  Lung cancer screening is recommended for adults aged 65-80 years who are at high risk for developing lung cancer because of a history of smoking. A yearly low-dose CT scan of the lungs is recommended for people who have at least a 30-pack-year history of smoking and are current smokers or have quit within the past 15 years. A pack year of smoking is smoking an average of 1 pack of cigarettes a day for 1 year (for example, a 30-pack-year history of smoking could mean smoking 1 pack a day for 30 years or 2 packs a day for 15 years). Yearly screening should continue until the smoker has stopped smoking for at least 15 years. Yearly screening should be stopped for people who develop a health problem that would prevent them from having  lung cancer treatment.  If you choose to drink alcohol, do not have more than 2 drinks per day. One drink is considered to be 12 oz (360 mL) of beer, 5 oz (150 mL) of wine, or 1.5 oz (45 mL) of liquor.  Avoid the use of street drugs. Do not share needles with anyone. Ask for help if you need support or instructions about stopping the use of drugs.  High blood pressure causes heart disease and increases the risk of stroke. High  blood pressure is more likely to develop in:  People who have blood pressure in the end of the normal range (100-139/85-89 mm Hg).  People who are overweight or obese.  People who are African American.  If you are 55-64 years of age, have your blood pressure checked every 3-5 years. If you are 44 years of age or older, have your blood pressure checked every year. You should have your blood pressure measured twice--once when you are at a hospital or clinic, and once when you are not at a hospital or clinic. Record the average of the two measurements. To check your blood pressure when you are not at a hospital or clinic, you can use:  An automated blood pressure machine at a pharmacy.  A home blood pressure monitor.  If you are 53-51 years old, ask your health care provider if you should take aspirin to prevent heart disease.  Diabetes screening involves taking a blood sample to check your fasting blood sugar level. This should be done once every 3 years after age 27 if you are at a normal weight and without risk factors for diabetes. Testing should be considered at a younger age or be carried out more frequently if you are overweight and have at least 1 risk factor for diabetes.  Colorectal cancer can be detected and often prevented. Most routine colorectal cancer screening begins at the age of 31 and continues through age 68. However, your health care provider may recommend screening at an earlier age if you have risk factors for colon cancer. On a yearly basis, your health care provider may provide home test kits to check for hidden blood in the stool. A small camera at the end of a tube may be used to directly examine the colon (sigmoidoscopy or colonoscopy) to detect the earliest forms of colorectal cancer. Talk to your health care provider about this at age 57 when routine screening begins. A direct exam of the colon should be repeated every 5-10 years through age 26, unless early forms of  precancerous polyps or small growths are found.  People who are at an increased risk for hepatitis B should be screened for this virus. You are considered at high risk for hepatitis B if:  You were born in a country where hepatitis B occurs often. Talk with your health care provider about which countries are considered high risk.  Your parents were born in a high-risk country and you have not received a shot to protect against hepatitis B (hepatitis B vaccine).  You have HIV or AIDS.  You use needles to inject street drugs.  You live with, or have sex with, someone who has hepatitis B.  You are a man who has sex with other men (MSM).  You get hemodialysis treatment.  You take certain medicines for conditions like cancer, organ transplantation, and autoimmune conditions.  Hepatitis C blood testing is recommended for all people born from [birth year] through 1963 and any individual with known risk  factors for hepatitis C.  Healthy men should no longer receive prostate-specific antigen (PSA) blood tests as part of routine cancer screening. Talk to your health care provider about prostate cancer screening.  Testicular cancer screening is not recommended for adolescents or adult males who have no symptoms. Screening includes self-exam, a health care provider exam, and other screening tests. Consult with your health care provider about any symptoms you have or any concerns you have about testicular cancer.  Practice safe sex. Use condoms and avoid high-risk sexual practices to reduce the spread of sexually transmitted infections (STIs).  You should be screened for STIs, including gonorrhea and chlamydia if:  You are sexually active and are younger than 24 years.  You are older than 24 years, and your health care provider tells you that you are at risk for this type of infection.  Your sexual activity has changed since you were last screened, and you are at an increased risk for chlamydia or  gonorrhea. Ask your health care provider if you are at risk.  If you are at risk of being infected with HIV, it is recommended that you take a prescription medicine daily to prevent HIV infection. This is called pre-exposure prophylaxis (PrEP). You are considered at risk if:  You are a man who has sex with other men (MSM).  You are a heterosexual man who is sexually active with multiple partners.  You take drugs by injection.  You are sexually active with a partner who has HIV.  Talk with your health care provider about whether you are at high risk of being infected with HIV. If you choose to begin PrEP, you should first be tested for HIV. You should then be tested every 3 months for as long as you are taking PrEP.  Use sunscreen. Apply sunscreen liberally and repeatedly throughout the day. You should seek shade when your shadow is shorter than you. Protect yourself by wearing long sleeves, pants, a wide-brimmed hat, and sunglasses year round whenever you are outdoors.  Tell your health care provider of new moles or changes in moles, especially if there is a change in shape or color. Also, tell your health care provider if a mole is larger than the size of a pencil eraser.  A one-time screening for abdominal aortic aneurysm (AAA) and surgical repair of large AAAs by ultrasound is recommended for men aged 71-75 years who are current or former smokers.  Stay current with your vaccines (immunizations).   This information is not intended to replace advice given to you by your health care provider. Make sure you discuss any questions you have with your health care provider.   Document Released: 11/22/2007 Document Revised: 06/16/2014 Document Reviewed: 10/21/2010 Elsevier Interactive Patient Education [employer].

## 2015-10-11 NOTE — Progress Notes (Signed)
Subjective:    Patient ID: Timothy [name], male    DOB: Sep 06, 1955, 60 y.o.   MRN: [id number]  HPI He is here to establish with a new pcp.   He is here for a physical exam.   Sciatica:  Two years ago he had his first bout of sciatica.  It just started up again two days ago.  He has some pain/numbnesss in his left glut and had some symptoms down his leg.  His symptoms are mild.  He has down some stretches at home.  He has not taken anything for it.  His daughter is getting married next week.   No other concerns.  No changes in his history.   Medications and allergies reviewed with patient and updated if appropriate.  Patient Active Problem List   Diagnosis Date Noted  . Basal cell carcinoma 09/08/2014  . ED (erectile dysfunction) 05/28/2012  . Family history of ischemic heart disease 02/20/2012  . ELEVATED PROSTATE SPECIFIC ANTIGEN 10/11/2008  . Hypothyroidism 08/30/2007  . 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 cetirizine (ZYRTEC) 10 MG tablet Take 10 mg by mouth daily.    . clindamycin (CLEOCIN T) 1 % external solution Apply 1 application topically as needed.    Marland Kitchen levothyroxine (SYNTHROID, LEVOTHROID) 150 MCG tablet TAKE 1 &1/2 pills every day EXCEPT one pill Tues and Thurs 45 tablet 1  . tadalafil (CIALIS) 5 MG tablet Take 1 tablet (5 mg total) by mouth daily. 90 tablet 3  . triamcinolone (NASACORT AQ) 55 MCG/ACT nasal inhaler 2 sprays by Nasal route daily.       No current facility-administered medications on file prior to visit.    Past Medical History  Diagnosis Date  . Allergy     seasonal  . Hypothyroidism   . ED (erectile dysfunction)   . Rosanna Randy syndrome     Past Surgical History  Procedure Laterality Date  . Appendectomy    . Tonsillectomy    . Colonoscopy  2005 & 2011    Normal; [facility]  . Carpal tunnel release  5/12/20007    Bilateral ;  Dr Fredna Dow  . Benign nevi       Removed x 3/ Dr. Crista Luria   . Basal cell carcinoma excision  08/2014    Social History   Social History  . Marital Status: Married    Spouse Name: N/A  . Number of Children: N/A  . Years of Education: N/A   Social History Main Topics  . Smoking status: Never Smoker   . Smokeless tobacco: None  . Alcohol Use: Yes     Comment:  20 servings of beer or wine  . Drug Use: No  . Sexual Activity: Not Asked   Other Topics Concern  . None   Social History Narrative    Family History  Problem Relation Age of Onset  . Hypothyroidism Father   . Colon polyps Father   . Heart attack Father 23    smoker  . Hypothyroidism Mother   . Heart attack Mother 103    smoker  . Colonic polyp Mother   . Hypothyroidism Brother      X 3  . Melanoma Brother   . Diabetes Daughter     Type 1  . Stroke Neg Hx   . Melanoma Father     Review of Systems  Constitutional: Negative for fever, chills, appetite change, fatigue and unexpected weight change.  HENT: Negative for hearing loss.   Eyes: Negative for visual disturbance.  Respiratory: Negative for cough, shortness of breath and wheezing.   Cardiovascular: Negative for chest pain, palpitations and leg swelling.  Gastrointestinal: Negative for nausea, abdominal pain, diarrhea, constipation and blood in stool.       No gerd  Genitourinary: Negative for dysuria, hematuria and difficulty urinating.  Musculoskeletal: Positive for back pain. Negative for myalgias, joint swelling and arthralgias.  Skin: Negative for color change and rash.  Neurological: Negative for dizziness, light-headedness and headaches.  Psychiatric/Behavioral: Negative for sleep disturbance and dysphoric mood. The patient is not nervous/anxious.        Objective:   Filed Vitals:   10/11/15 0802  BP: 122/76  Pulse: 48  Temp: 97.8 F (36.6 C)  Resp: 16   Filed Weights   10/11/15 0802  Weight: 195 lb (88.451 kg)   Body mass  index is 25.73 kg/(m^2).   Physical Exam Constitutional: He appears well-developed and well-nourished. No distress.  HENT:  Head: Normocephalic and atraumatic.  Right Ear: External ear normal.  Left Ear: External ear normal.  Mouth/Throat: Oropharynx is clear and moist.  Normal ear canals and TM b/l  Eyes: Conjunctivae and EOM are normal.  Neck: Neck supple. No tracheal deviation present. No thyromegaly present.  No carotid bruit  Cardiovascular: Normal rate, regular rhythm, normal heart sounds and intact distal pulses.   No murmur heard. Pulmonary/Chest: Effort normal and breath sounds normal. No respiratory distress. He has no wheezes. He has no rales.  Abdominal: Soft. Bowel sounds are normal. He exhibits no distension. There is no tenderness.  Genitourinary: slightly enlarged prostate, no nodules Musculoskeletal: He exhibits no edema. no tenderness in lower back or left SI joint, mild increased tightness in left hamstring with flexion Lymphadenopathy:   He has no cervical adenopathy.  Skin: Skin is warm and dry. He is not diaphoretic.  Psychiatric: He has a normal mood and affect. His behavior is normal.       Assessment & Plan:    Physical exam: Screening blood work  ordered Immunizations  tdap today, other immunizations up to date Colonoscopy  Up to date  Eye exams - has not had one in a while - advised him to have one done EKG - normal a few years ago, exercises regularly with good intensity and no symptoms, no EKG needed today Exercise - regular Weight - WNL Skin  - sees derm annually Substance abuse - no concern for abuse  See Problem List for Assessment and Plan of chronic medical problems.

## 2015-10-11 NOTE — Progress Notes (Signed)
Pre visit review using our clinic review tool, if applicable. No additional management support is needed unless otherwise documented below in the visit note. 

## 2015-10-11 NOTE — Assessment & Plan Note (Signed)
Check tsh  Titrate med dose if needed  

## 2015-10-12 ENCOUNTER — Encounter: Payer: Self-pay | Admitting: Gastroenterology

## 2015-10-12 LAB — HEPATITIS C ANTIBODY: HCV Ab: NEGATIVE

## 2015-10-12 LAB — HIV ANTIBODY (ROUTINE TESTING W REFLEX): HIV: NONREACTIVE

## 2015-10-12 LAB — PSA, TOTAL AND FREE
PSA FREE: 0.51 ng/mL
PSA, Free Pct: 32 % (ref >25)
PSA: 1.58 ng/mL (ref <=4.00)

## 2015-10-14 ENCOUNTER — Encounter: Payer: Self-pay | Admitting: Internal Medicine

## 2015-10-25 ENCOUNTER — Encounter: Payer: Self-pay | Admitting: Gastroenterology

## 2015-12-06 ENCOUNTER — Ambulatory Visit (AMBULATORY_SURGERY_CENTER): Payer: Self-pay | Admitting: *Deleted

## 2015-12-06 VITALS — Ht 73.0 in | Wt 193.6 lb

## 2015-12-06 DIAGNOSIS — Z8371 Family history of colonic polyps: Secondary | ICD-10-CM

## 2015-12-06 MED ORDER — NA SULFATE-K SULFATE-MG SULF 17.5-3.13-1.6 GM/177ML PO SOLN
1.0000 | Freq: Once | ORAL | Status: DC
Start: 2015-12-06 — End: 2015-12-20

## 2015-12-06 NOTE — Progress Notes (Signed)
Denies allergies to eggs or soy products. Denies complications with sedation or anesthesia. Denies O2 use. Denies use of diet or weight loss medications.  Emmi instructions given for colonoscopy.  

## 2015-12-12 ENCOUNTER — Encounter: Payer: Self-pay | Admitting: Gastroenterology

## 2015-12-20 ENCOUNTER — Ambulatory Visit (AMBULATORY_SURGERY_CENTER): Payer: Managed Care, Other (non HMO) | Admitting: Gastroenterology

## 2015-12-20 ENCOUNTER — Encounter: Payer: Self-pay | Admitting: Gastroenterology

## 2015-12-20 VITALS — BP 121/76 | HR 57 | Temp 97.5°F | Resp 17 | Ht 73.0 in | Wt 193.0 lb

## 2015-12-20 DIAGNOSIS — D120 Benign neoplasm of cecum: Secondary | ICD-10-CM

## 2015-12-20 DIAGNOSIS — Z8371 Family history of colonic polyps: Secondary | ICD-10-CM

## 2015-12-20 DIAGNOSIS — D123 Benign neoplasm of transverse colon: Secondary | ICD-10-CM

## 2015-12-20 DIAGNOSIS — Z1211 Encounter for screening for malignant neoplasm of colon: Secondary | ICD-10-CM | POA: Diagnosis present

## 2015-12-20 MED ORDER — SODIUM CHLORIDE 0.9 % IV SOLN: 500.0000 mL | INTRAVENOUS | Status: DC

## 2015-12-20 NOTE — Progress Notes (Signed)
To PACU Pt awake and alert. Report to RN 

## 2015-12-20 NOTE — Op Note (Signed)
[facility] Patient Name: Timothy [name] Procedure Date: 12/20/2015 10:38 AM MRN: [id number] Endoscopist: Ladene Artist , MD Age: 60 Referring MD:  Date of Birth: [birth year] Gender: Male Account #: [id number] Procedure:                Colonoscopy Indications:              Colon cancer screening in patient at increased                            risk: Family history of colon polyps in multiple                            1st-degree relatives Medicines:                Monitored Anesthesia Care Procedure:                Pre-Anesthesia Assessment:                           - Prior to the procedure, a History and Physical                            was performed, and patient medications and                            allergies were reviewed. The patient's tolerance of                            previous anesthesia was also reviewed. The risks                            and benefits of the procedure and the sedation                            options and risks were discussed with the patient.                            All questions were answered, and informed consent                            was obtained. Prior Anticoagulants: The patient has                            taken no previous anticoagulant or antiplatelet                            agents. ASA Grade Assessment: II - A patient with                            mild systemic disease. After reviewing the risks                            and benefits, the patient was deemed in  satisfactory condition to undergo the procedure.                           After obtaining informed consent, the colonoscope                            was passed under direct vision. Throughout the                            procedure, the patient's blood pressure, pulse, and                            oxygen saturations were monitored continuously. The                            Model PCF-H190DL [id number]) scope was  introduced                            through the anus and advanced to the the cecum,                            identified by appendiceal orifice and ileocecal                            valve. The ileocecal valve, appendiceal orifice,                            and rectum were photographed. The quality of the                            bowel preparation was excellent. The colonoscopy                            was performed without difficulty. The patient                            tolerated the procedure well. Scope In: 10:46:39 AM Scope Out: 11:02:00 AM Scope Withdrawal Time: 0 hours 13 minutes 45 seconds  Total Procedure Duration: 0 hours 15 minutes 21 seconds  Findings:                 Two sessile polyps were found in the transverse                            colon and cecum. The polyps were 5 to 6 mm in size.                            These polyps were removed with a cold biopsy                            forceps. Resection and retrieval were complete.                           Internal hemorrhoids were found during  retroflexion. The hemorrhoids were small and Grade                            I (internal hemorrhoids that do not prolapse).                           The exam was otherwise without abnormality on                            direct and retroflexion views. Complications:            No immediate complications. Estimated blood loss:                            None. Estimated Blood Loss:     Estimated blood loss: none. Impression:               - Two 5 to 6 mm polyps in the transverse colon and                            in the cecum, removed with a cold biopsy forceps.                            Resected and retrieved.                           - Internal hemorrhoids.                           - The examination was otherwise normal on direct                            and retroflexion views. Recommendation:           - Repeat colonoscopy in 5  years for surveillance.                           - Patient has a contact number available for                            emergencies. The signs and symptoms of potential                            delayed complications were discussed with the                            patient. Return to normal activities tomorrow.                            Written discharge instructions were provided to the                            patient.                           - Resume previous diet.                           -  Continue present medications.                           - Await pathology results. Ladene Artist, MD 12/20/2015 11:11:23 AM This report has been signed electronically.

## 2015-12-20 NOTE — Progress Notes (Signed)
Called to room to assist during endoscopic procedure.  Patient ID and intended procedure confirmed with present staff. Received instructions for my participation in the procedure from the performing physician.  

## 2015-12-20 NOTE — Patient Instructions (Signed)
Impressions/recommendations:  Polyps (handout given) Hemorrhoids (handout given)  Repeat colonoscopy in 5 years.  YOU HAD AN ENDOSCOPIC PROCEDURE TODAY AT [facility]:   Refer to the procedure report that was given to you for any specific questions about what was found during the examination.  If the procedure report does not answer your questions, please call your gastroenterologist to clarify.  If you requested that your care partner not be given the details of your procedure findings, then the procedure report has been included in a sealed envelope for you to review at your convenience later.  YOU SHOULD EXPECT: Some feelings of bloating in the abdomen. Passage of more gas than usual.  Walking can help get rid of the air that was put into your GI tract during the procedure and reduce the bloating. If you had a lower endoscopy (such as a colonoscopy or flexible sigmoidoscopy) you may notice spotting of blood in your stool or on the toilet paper. If you underwent a bowel prep for your procedure, you may not have a normal bowel movement for a few days.  Please Note:  You might notice some irritation and congestion in your nose or some drainage.  This is from the oxygen used during your procedure.  There is no need for concern and it should clear up in a day or so.  SYMPTOMS TO REPORT IMMEDIATELY:   Following lower endoscopy (colonoscopy or flexible sigmoidoscopy):  Excessive amounts of blood in the stool  Significant tenderness or worsening of abdominal pains  Swelling of the abdomen that is new, acute  Fever of 100F or higher  For urgent or emergent issues, a gastroenterologist can be reached at any hour by calling [phone].   DIET: Your first meal following the procedure should be a small meal and then it is ok to progress to your normal diet. Heavy or fried foods are harder to digest and may make you feel nauseous or bloated.  Likewise, meals heavy in dairy and  vegetables can increase bloating.  Drink plenty of fluids but you should avoid alcoholic beverages for 24 hours.  ACTIVITY:  You should plan to take it easy for the rest of today and you should NOT DRIVE or use heavy machinery until tomorrow (because of the sedation medicines used during the test).    FOLLOW UP: Our staff will call the number listed on your records the next business day following your procedure to check on you and address any questions or concerns that you may have regarding the information given to you following your procedure. If we do not reach you, we will leave a message.  However, if you are feeling well and you are not experiencing any problems, there is no need to return our call.  We will assume that you have returned to your regular daily activities without incident.  If any biopsies were taken you will be contacted by phone or by letter within the next 1-3 weeks.  Please call us at [phone] if you have not heard about the biopsies in 3 weeks.    SIGNATURES/CONFIDENTIALITY: You and/or your care partner have signed paperwork which will be entered into your electronic medical record.  These signatures attest to the fact that that the information above on your After Visit Summary has been reviewed and is understood.  Full responsibility of the confidentiality of this discharge information lies with you and/or your care-partner.

## 2015-12-21 ENCOUNTER — Telehealth: Payer: Self-pay | Admitting: *Deleted

## 2015-12-21 NOTE — Telephone Encounter (Signed)
Left message on home phone # on f/u call

## 2015-12-26 ENCOUNTER — Encounter: Payer: Self-pay | Admitting: Gastroenterology

## 2016-02-22 ENCOUNTER — Telehealth: Payer: Self-pay

## 2016-02-22 NOTE — Telephone Encounter (Signed)
Patient states he hurt his ankle a couple of weeks ago. And has been icing and doing such things with it. It is not feeling any better. He would like a referral to an ortho doctor. Please advise or follow up. Thank you.

## 2016-02-22 NOTE — Telephone Encounter (Signed)
Can see greg on Monday or I can refer to ortho, but may take a while to see them

## 2016-02-22 NOTE — Telephone Encounter (Signed)
Please advise if pt should just come see Marya Amsler or Dr Tamala Julian. Or should see ortho.

## 2016-02-25 NOTE — Telephone Encounter (Signed)
Spoke with pt, scheduled appt with Terri Piedra on 9/20

## 2016-02-27 ENCOUNTER — Ambulatory Visit (INDEPENDENT_AMBULATORY_CARE_PROVIDER_SITE_OTHER): Payer: Managed Care, Other (non HMO) | Admitting: Family

## 2016-02-27 ENCOUNTER — Encounter: Payer: Self-pay | Admitting: Family

## 2016-02-27 VITALS — BP 126/84 | HR 50 | Temp 97.7°F | Resp 16 | Ht 73.0 in | Wt 195.0 lb

## 2016-02-27 DIAGNOSIS — Z23 Encounter for immunization: Secondary | ICD-10-CM | POA: Diagnosis not present

## 2016-02-27 DIAGNOSIS — S93401A Sprain of unspecified ligament of right ankle, initial encounter: Secondary | ICD-10-CM | POA: Diagnosis not present

## 2016-02-27 DIAGNOSIS — S93409A Sprain of unspecified ligament of unspecified ankle, initial encounter: Secondary | ICD-10-CM | POA: Insufficient documentation

## 2016-02-27 NOTE — Progress Notes (Signed)
Subjective:    Patient ID: Timothy [name], male    DOB: Jan 23, 1956, 60 y.o.   MRN: [id number]  Chief Complaint  Patient presents with  . Ankle Pain    Right ankle pain x3 weeks, hurt it water skiing     HPI:  Timothy [name] is a 60 y.o. male who  has a past medical history of Allergy; ED (erectile dysfunction); Gilbert syndrome; and Hypothyroidism. and presents today for an acute office visit.  This is a new problem. Associated symptom of pain located in his right ankle has been going on for about 3 weeks following an incident on water skiis where he describes his foot being dorsiflexed. Initial swelling and discoloration of the lateral ankle. Modifying factors include ankle brace, ice and compression. He has also used ibuprofen which has helped. Denies any numbness and tingling. Course of the symptoms have gradually improved.   No Known Allergies    Outpatient Medications Prior to Visit  Medication Sig Dispense Refill  . cetirizine (ZYRTEC) 10 MG tablet Take 10 mg by mouth daily.    Marland Kitchen levothyroxine (SYNTHROID, LEVOTHROID) 150 MCG tablet TAKE 1 &1/2 pills every day EXCEPT one pill Tues and Thurs 114 tablet 3  . tadalafil (CIALIS) 5 MG tablet Take 1 tablet (5 mg total) by mouth daily. 90 tablet 3  . triamcinolone (NASACORT AQ) 55 MCG/ACT nasal inhaler 2 sprays by Nasal route daily.       No facility-administered medications prior to visit.       Past Surgical History:  Procedure Laterality Date  . APPENDECTOMY    . BASAL CELL CARCINOMA EXCISION  08/2014  . Benign Nevi      Removed x 3/ Dr. Crista Luria   . CARPAL TUNNEL RELEASE  5/12/20007   Bilateral ; Dr Fredna Dow  . COLONOSCOPY  2005 & 2011   Normal; [facility]  . TONSILLECTOMY        Past Medical History:  Diagnosis Date  . Allergy    seasonal  . ED (erectile dysfunction)   . Gilbert syndrome   . Hypothyroidism       Review of Systems  Constitutional: Negative for chills and fever.  Musculoskeletal:   Positive for right ankle pain  Neurological: Negative for weakness and numbness.      Objective:    BP 126/84 (BP Location: Left Arm, Patient Position: Sitting, Cuff Size: Large)   Pulse (!) 50   Temp 97.7 F (36.5 C) (Oral)   Resp 16   Ht 6\' 1"  (1.854 m)   Wt 195 lb (88.5 kg)   SpO2 98%   BMI 25.73 kg/m  Nursing note and vital signs reviewed.  Physical Exam  Constitutional: He is oriented to person, place, and time. He appears well-developed and well-nourished. No distress.  Cardiovascular: Normal rate, regular rhythm, normal heart sounds and intact distal pulses.   Pulmonary/Chest: Effort normal and breath sounds normal.  Musculoskeletal:  Right ankle - no obvious deformity, discoloration, or edema. Tenderness elicited over anterior talofibular and calcaneofibular ligaments as well as Achilles tendon. No crepitus or deformity noted. Range of motion is within normal limits with mild discomfort noted in extreme dorsiflexion. Strength is 5+. Distal pulses and sensation are intact and appropriate. Mild laxity noted with anterior drawer. All other ligamentous testing is negative.  Neurological: He is alert and oriented to person, place, and time.  Skin: Skin is warm and dry.  Psychiatric: He has a normal mood and affect. His behavior  is normal. Judgment and thought content normal.       Assessment & Plan:   Problem List Items Addressed This Visit      Musculoskeletal and Integument   Ankle sprain - Primary    Symptoms and exam consistent with second-degree lateral ankle sprain and mild tendinopathy of the Achilles tendon most likely related to previous swelling. Continue conservative treatment with ice/, exercise therapy and over-the-counter nonsteroidal anti-inflammatories as needed. Recommend a lace up ankle brace for support as needed. Follow-up if symptoms worsen or do not improve.       Other Visit Diagnoses    Encounter for immunization       Relevant Orders   Flu  Vaccine QUAD 36+ mos IM (Completed)       I am having Timothy [name] maintain his triamcinolone, cetirizine, tadalafil, and levothyroxine.  Follow-up: Return if symptoms worsen or fail to improve.  Mauricio Po, FNP

## 2016-02-27 NOTE — Patient Instructions (Signed)
Thank you for choosing [facility].  SUMMARY AND INSTRUCTIONS:  Ice 20 minutes every 2 hours as needed and following activity.  Exercises 1-2 times per day.  Ibuprofen/Aleve as needed for discomfort.  Place of ankle brace for support.  Activity Limited by sharp pains..  Medication:  Your prescription(s) have been submitted to your pharmacy or been printed and provided for you. Please take as directed and contact our office if you believe you are having problem(s) with the medication(s) or have any questions.  Follow up:  If your symptoms worsen or fail to improve, please contact our office for further instruction, or in case of emergency go directly to the emergency room at the closest medical facility.    Acute Ankle Sprain With Phase I Rehab An acute ankle sprain is a partial or complete tear in one or more of the ligaments of the ankle due to traumatic injury. The severity of the injury depends on both the number of ligaments sprained and the grade of sprain. There are 3 grades of sprains.   A grade 1 sprain is a mild sprain. There is a slight pull without obvious tearing. There is no loss of strength, and the muscle and ligament are the correct length.  A grade 2 sprain is a moderate sprain. There is tearing of fibers within the substance of the ligament where it connects two bones or two cartilages. The length of the ligament is increased, and there is usually decreased strength.  A grade 3 sprain is a complete rupture of the ligament and is uncommon. In addition to the grade of sprain, there are three types of ankle sprains.  Lateral ankle sprains: This is a sprain of one or more of the three ligaments on the outer side (lateral) of the ankle. These are the most common sprains. Medial ankle sprains: There is one large triangular ligament of the inner side (medial) of the ankle that is susceptible to injury. Medial ankle sprains are less common. Syndesmosis, "high  ankle," sprains: The syndesmosis is the ligament that connects the two bones of the lower leg. Syndesmosis sprains usually only occur with very severe ankle sprains. SYMPTOMS  Pain, tenderness, and swelling in the ankle, starting at the side of injury that may progress to the whole ankle and foot with time.  "Pop" or tearing sensation at the time of injury.  Bruising that may spread to the heel.  Impaired ability to walk soon after injury. CAUSES   Acute ankle sprains are caused by trauma placed on the ankle that temporarily forces or pries the anklebone (talus) out of its normal socket.  Stretching or tearing of the ligaments that normally hold the joint in place (usually due to a twisting injury). RISK INCREASES WITH:  Previous ankle sprain.  Sports in which the foot may land awkwardly (i.e., basketball, volleyball, or soccer) or walking or running on uneven or rough surfaces.  Shoes with inadequate support to prevent sideways motion when stress occurs.  Poor strength and flexibility.  Poor balance skills.  Contact sports. PREVENTION   Warm up and stretch properly before activity.  Maintain physical fitness:  Ankle and leg flexibility, muscle strength, and endurance.  Cardiovascular fitness.  Balance training activities.  Use proper technique and have a coach correct improper technique.  Taping, protective strapping, bracing, or high-top tennis shoes may help prevent injury. Initially, tape is best; however, it loses most of its support function within 10 to 15 minutes.  Wear proper-fitted protective shoes (High-top  shoes with taping or bracing is more effective than either alone).  Provide the ankle with support during sports and practice activities for 12 months following injury. PROGNOSIS   If treated properly, ankle sprains can be expected to recover completely; however, the length of recovery depends on the degree of injury.  A grade 1 sprain usually heals  enough in 5 to 7 days to allow modified activity and requires an average of 6 weeks to heal completely.  A grade 2 sprain requires 6 to 10 weeks to heal completely.  A grade 3 sprain requires 12 to 16 weeks to heal.  A syndesmosis sprain often takes more than 3 months to heal. RELATED COMPLICATIONS   Frequent recurrence of symptoms may result in a chronic problem. Appropriately addressing the problem the first time decreases the frequency of recurrence and optimizes healing time. Severity of the initial sprain does not predict the likelihood of later instability.  Injury to other structures (bone, cartilage, or tendon).  A chronically unstable or arthritic ankle joint is a possibility with repeated sprains. TREATMENT Treatment initially involves the use of ice, medication, and compression bandages to help reduce pain and inflammation. Ankle sprains are usually immobilized in a walking cast or boot to allow for healing. Crutches may be recommended to reduce pressure on the injury. After immobilization, strengthening and stretching exercises may be necessary to regain strength and a full range of motion. Surgery is rarely needed to treat ankle sprains. MEDICATION   Nonsteroidal anti-inflammatory medications, such as aspirin and ibuprofen (do not take for the first 3 days after injury or within 7 days before surgery), or other minor pain relievers, such as acetaminophen, are often recommended. Take these as directed by your caregiver. Contact your caregiver immediately if any bleeding, stomach upset, or signs of an allergic reaction occur from these medications.  Ointments applied to the skin may be helpful.  Pain relievers may be prescribed as necessary by your caregiver. Do not take prescription pain medication for longer than 4 to 7 days. Use only as directed and only as much as you need. HEAT AND COLD  Cold treatment (icing) is used to relieve pain and reduce inflammation for acute and  chronic cases. Cold should be applied for 10 to 15 minutes every 2 to 3 hours for inflammation and pain and immediately after any activity that aggravates your symptoms. Use ice packs or an ice massage.  Heat treatment may be used before performing stretching and strengthening activities prescribed by your caregiver. Use a heat pack or a warm soak. SEEK IMMEDIATE MEDICAL CARE IF:   Pain, swelling, or bruising worsens despite treatment.  You experience pain, numbness, discoloration, or coldness in the foot or toes.  New, unexplained symptoms develop (drugs used in treatment may produce side effects.) EXERCISES  PHASE I EXERCISES RANGE OF MOTION (ROM) AND STRETCHING EXERCISES - Ankle Sprain, Acute Phase I, Weeks 1 to 2 These exercises may help you when beginning to restore flexibility in your ankle. You will likely work on these exercises for the 1 to 2 weeks after your injury. Once your physician, physical therapist, or athletic trainer sees adequate progress, he or she will advance your exercises. While completing these exercises, remember:   Restoring tissue flexibility helps normal motion to return to the joints. This allows healthier, less painful movement and activity.  An effective stretch should be held for at least 30 seconds.  A stretch should never be painful. You should only feel a gentle lengthening  or release in the stretched tissue. RANGE OF MOTION - Dorsi/Plantar Flexion  While sitting with your right / left knee straight, draw the top of your foot upwards by flexing your ankle. Then reverse the motion, pointing your toes downward.  Hold each position for __________ seconds.  After completing your first set of exercises, repeat this exercise with your knee bent. Repeat __________ times. Complete this exercise __________ times per day.  RANGE OF MOTION - Ankle Alphabet  Imagine your right / left big toe is a pen.  Keeping your hip and knee still, write out the entire  alphabet with your "pen." Make the letters as large as you can without increasing any discomfort. Repeat __________ times. Complete this exercise __________ times per day.  STRENGTHENING EXERCISES - Ankle Sprain, Acute -Phase I, Weeks 1 to 2 These exercises may help you when beginning to restore strength in your ankle. You will likely work on these exercises for 1 to 2 weeks after your injury. Once your physician, physical therapist, or athletic trainer sees adequate progress, he or she will advance your exercises. While completing these exercises, remember:   Muscles can gain both the endurance and the strength needed for everyday activities through controlled exercises.  Complete these exercises as instructed by your physician, physical therapist, or athletic trainer. Progress the resistance and repetitions only as guided.  You may experience muscle soreness or fatigue, but the pain or discomfort you are trying to eliminate should never worsen during these exercises. If this pain does worsen, stop and make certain you are following the directions exactly. If the pain is still present after adjustments, discontinue the exercise until you can discuss the trouble with your clinician. STRENGTH - Dorsiflexors  Secure a rubber exercise band/tubing to a fixed object (i.e., table, pole) and loop the other end around your right / left foot.  Sit on the floor facing the fixed object. The band/tubing should be slightly tense when your foot is relaxed.  Slowly draw your foot back toward you using your ankle and toes.  Hold this position for __________ seconds. Slowly release the tension in the band and return your foot to the starting position. Repeat __________ times. Complete this exercise __________ times per day.  STRENGTH - Plantar-flexors   Sit with your right / left leg extended. Holding onto both ends of a rubber exercise band/tubing, loop it around the ball of your foot. Keep a slight tension in  the band.  Slowly push your toes away from you, pointing them downward.  Hold this position for __________ seconds. Return slowly, controlling the tension in the band/tubing. Repeat __________ times. Complete this exercise __________ times per day.  STRENGTH - Ankle Eversion  Secure one end of a rubber exercise band/tubing to a fixed object (table, pole). Loop the other end around your foot just before your toes.  Place your fists between your knees. This will focus your strengthening at your ankle.  Drawing the band/tubing across your opposite foot, slowly, pull your little toe out and up. Make sure the band/tubing is positioned to resist the entire motion.  Hold this position for __________ seconds. Have your muscles resist the band/tubing as it slowly pulls your foot back to the starting position.  Repeat __________ times. Complete this exercise __________ times per day.  STRENGTH - Ankle Inversion  Secure one end of a rubber exercise band/tubing to a fixed object (table, pole). Loop the other end around your foot just before your toes.  Place  your fists between your knees. This will focus your strengthening at your ankle.  Slowly, pull your big toe up and in, making sure the band/tubing is positioned to resist the entire motion.  Hold this position for __________ seconds.  Have your muscles resist the band/tubing as it slowly pulls your foot back to the starting position. Repeat __________ times. Complete this exercises __________ times per day.  STRENGTH - Towel Curls  Sit in a chair positioned on a non-carpeted surface.  Place your right / left foot on a towel, keeping your heel on the floor.  Pull the towel toward your heel by only curling your toes. Keep your heel on the floor.  If instructed by your physician, physical therapist, or athletic trainer, add weight to the end of the towel. Repeat __________ times. Complete this exercise __________ times per day.   This  information is not intended to replace advice given to you by your health care provider. Make sure you discuss any questions you have with your health care provider.   Document Released: 12/25/2004 Document Revised: 06/16/2014 Document Reviewed: 09/07/2008 [employer] Interactive Patient Education 2016 [employer].    Acute Ankle Sprain With Phase II Rehab An acute ankle sprain is a partial or complete tear in one or more of the ligaments of the ankle due to traumatic injury. The severity of the injury depends on both the number of ligaments sprained and the grade of sprain. There are 3 grades of sprains.  A grade 1 sprain is a mild sprain. There is a slight pull without obvious tearing. There is no loss of strength, and the muscle and ligament are the correct length.  A grade 2 sprain is a moderate sprain. There is tearing of fibers within the substance of the ligament where it connects two bones or two cartilages. The length of the ligament is increased, and there is usually decreased strength.  A grade 3 sprain is a complete rupture of the ligament and is uncommon. In addition to the grade of sprain, there are 3 types of ankle sprains.  Lateral ankle sprains. This is a sprain of one or more of the 3 ligaments on the outer side (lateral) of the ankle. These are the most common sprains. Medial ankle sprains. There is one large triangular ligament on the inner side (medial) of the ankle that is susceptible to injury. Medial ankle sprains are less common. Syndesmosis, "high ankle," sprains. The syndesmosis is the ligament that connects the two bones of the lower leg. Syndesmosis sprains usually only occur with very severe ankle sprains. SYMPTOMS  Pain, tenderness, and swelling in the ankle, starting at the side of injury that may progress to the whole ankle and foot with time.  "Pop" or tearing sensation at the time of injury.  Bruising that may spread to the heel.  Impaired ability to walk  soon after injury. CAUSES   Acute ankle sprains are caused by trauma placed on the ankle that temporarily forces or pries the anklebone (talus) out of its normal socket.  Stretching or tearing of the ligaments that normally hold the joint in place (usually due to a twisting injury). RISK INCREASES WITH:  Previous ankle sprain.  Sports in which the foot may land awkwardly (basketball, volleyball, soccer) or walking or running on uneven or rough surfaces.  Shoes with inadequate support to prevent sideways motion when stress occurs.  Poor strength and flexibility.  Poor balance skills.  Contact sports. PREVENTION  Warm up and stretch  properly before activity.  Maintain physical fitness:  Ankle and leg flexibility, muscle strength, and endurance.  Cardiovascular fitness.  Balance training activities.  Use proper technique and have a coach correct improper technique.  Taping, protective strapping, bracing, or high-top tennis shoes may help prevent injury. Initially, tape is best. However, it loses most of its support function within 10 to 15 minutes.  Wear proper fitted protective shoes. Combining high-top shoes with taping or bracing is more effective than using either alone.  Provide the ankle with support during sports and practice activities for 12 months following injury. PROGNOSIS   If treated properly, ankle sprains can be expected to recover completely. However, the length of recovery depends on the degree of injury.  A grade 1 sprain usually heals enough in 5 to 7 days to allow modified activity and requires an average of 6 weeks to heal completely.  A grade 2 sprain requires 6 to 10 weeks to heal completely.  A grade 3 sprain requires 12 to 16 weeks to heal.  A syndesmosis sprain often takes more than 3 months to heal. RELATED COMPLICATIONS   Frequent recurrence of symptoms may result in a chronic problem. Appropriately addressing the problem the first time  decreases the frequency of recurrence and optimizes healing time. Severity of initial sprain does not predict the likelihood of later instability.  Injury to other structures (bone, cartilage, or tendon).  Chronically unstable or arthritic ankle joint are possible with repeated sprains. TREATMENT Treatment initially involves the use of ice, medicine, and compression bandages to help reduce pain and inflammation. Ankle sprains are usually immobilized in a walking cast or boot to allow for healing. Crutches may be recommended to reduce pressure on the injury. After immobilization, strengthening and stretching exercises may be necessary to regain strength and a full range of motion. Surgery is rarely needed to treat ankle sprains. MEDICATION   Nonsteroidal anti-inflammatory medicines, such as aspirin and ibuprofen (do not take for the first 3 days after injury or within 7 days before surgery), or other minor pain relievers, such as acetaminophen, are often recommended. Take these as directed by your caregiver. Contact your caregiver immediately if any bleeding, stomach upset, or signs of an allergic reaction occur from these medicines.  Ointments applied to the skin may be helpful.  Pain relievers may be prescribed as necessary by your caregiver. Do not take prescription pain medicine for longer than 4 to 7 days. Use only as directed and only as much as you need. HEAT AND COLD  Cold treatment (icing) is used to relieve pain and reduce inflammation for acute and chronic cases. Cold should be applied for 10 to 15 minutes every 2 to 3 hours for inflammation and pain and immediately after any activity that aggravates your symptoms. Use ice packs or an ice massage.  Heat treatment may be used before performing stretching and strengthening activities prescribed by your caregiver. Use a heat pack or a warm soak. SEEK IMMEDIATE MEDICAL CARE IF:   Pain, swelling, or bruising worsens despite  treatment.  You experience pain, numbness, discoloration, or coldness in the foot or toes.  New, unexplained symptoms develop. (Drugs used in treatment may produce side effects.) EXERCISES  PHASE II EXERCISES RANGE OF MOTION (ROM) AND STRETCHING EXERCISES - Ankle Sprain, Acute-Phase II, Weeks 3 to 4 After your physician, physical therapist, or athletic trainer feels your knee has made progress significant enough to begin more advanced exercises, he or she may recommend completing some of the  following exercises. Although each person heals at different rates, most people will be ready for these exercises between 3 and 4 weeks after their injury. Do not begin these exercises until you have your caregiver's permission. He or she may also advise you to continue with the exercises which you completed in Phase I of your rehabilitation. While completing these exercises, remember:   Restoring tissue flexibility helps normal motion to return to the joints. This allows healthier, less painful movement and activity.  An effective stretch should be held for at least 30 seconds.  A stretch should never be painful. You should only feel a gentle lengthening or release in the stretched tissue. RANGE OF MOTION - Ankle Plantar Flexion   Sit with your right / left leg crossed over your opposite knee.  Use your opposite hand to pull the top of your foot and toes toward you.  You should feel a gentle stretch on the top of your foot/ankle. Hold this position for __________. Repeat __________ times. Complete __________ times per day.  RANGE OF MOTION - Ankle Eversion  Sit with your right / left ankle crossed over your opposite knee.  Grip your foot with your opposite hand, placing your thumb on the top of your foot and your fingers across the bottom of your foot.  Gently push your foot downward with a slight rotation so your littlest toes rise slightly  You should feel a gentle stretch on the inside of your  ankle. Hold the stretch for __________ seconds. Repeat __________ times. Complete this exercise __________ times per day.  RANGE OF MOTION - Ankle Inversion  Sit with your right / left ankle crossed over your opposite knee.  Grip your foot with your opposite hand, placing your thumb on the bottom of your foot and your fingers across the top of your foot.  Gently pull your foot so the smallest toe comes toward you and your thumb pushes the inside of the ball of your foot away from you.  You should feel a gentle stretch on the outside of your ankle. Hold the stretch for __________ seconds. Repeat __________ times. Complete this exercise __________ times per day.  STRETCH - Gastrocsoleus  Sit with your right / left leg extended. Holding onto both ends of a belt or towel, loop it around the ball of your foot.  Keeping your right / left ankle and foot relaxed and your knee straight, pull your foot and ankle toward you using the belt/towel.  You should feel a gentle stretch behind your calf or knee. Hold this position for __________ seconds. Repeat __________ times. Complete this stretch __________ times per day.  RANGE OF MOTION - Ankle Dorsiflexion, Active Assisted  Remove shoes and sit on a chair that is preferably not on a carpeted surface.  Place right / left foot under knee. Extend your opposite leg for support.  Keeping your heel down, slide your right / left foot back toward the chair until you feel a stretch at your ankle or calf. If you do not feel a stretch, slide your bottom forward to the edge of the chair while still keeping your heel down.  Hold this stretch for __________ seconds. Repeat __________ times. Complete this stretch __________ times per day.  STRETCH - Gastroc, Standing   Place hands on wall.  Extend right / left leg and place a folded washcloth under the arch of your foot for support. Keep the front knee somewhat bent.  Slightly point your toes inward  on your  back foot.  Keeping your right / left heel on the floor and your knee straight, shift your weight toward the wall, not allowing your back to arch.  You should feel a gentle stretch in the calf. Hold this position for __________ seconds. Repeat __________ times. Complete this stretch __________ times per day. STRETCH - Soleus, Standing  Place hands on wall.  Extend right / left leg and place a folded washcloth under the arch of your foot for support. Keep the front knee somewhat bent.  Slightly point your toes inward on your back foot.  Keep your right / left heel on the floor, bend your back knee, and slightly shift your weight over the back leg so that you feel a gentle stretch deep in your back calf.  Hold this position for __________ seconds. Repeat __________ times. Complete this stretch __________ times per day. STRETCH - Gastrocsoleus, Standing Note: This exercise can place a lot of stress on your foot and ankle. Please complete this exercise only if specifically instructed by your caregiver.   Place the ball of your right / left foot on a step, keeping your other foot firmly on the same step.  Hold on to the wall or a rail for balance.  Slowly lift your other foot, allowing your body weight to press your heel down over the edge of the step.  You should feel a stretch in your right / left calf.  Hold this position for __________ seconds.  Repeat this exercise with a slight bend in your knee. Repeat __________ times. Complete this stretch __________ times per day.  STRENGTHENING EXERCISES - Ankle Sprain, Acute-Phase II Around 3 to 4 weeks after your injury, you may progress to some of these exercises in your rehabilitation program. Do not begin these until you have your caregiver's permission. Although your condition has improved, the Phase I exercises will continue to be helpful and you may continue to complete them. As you complete strengthening exercises, remember:    Strong muscles with good endurance tolerate stress better.  Do the exercises as initially prescribed by your caregiver. Progress slowly with each exercise, gradually increasing the number of repetitions and weight used under his or her guidance.  You may experience muscle soreness or fatigue, but the pain or discomfort you are trying to eliminate should never worsen during these exercises. If this pain does worsen, stop and make certain you are following the directions exactly. If the pain is still present after adjustments, discontinue the exercise until you can discuss the trouble with your caregiver. STRENGTH - Plantar-flexors, Standing  Stand with your feet shoulder width apart. Steady yourself with a wall or table using as little support as needed.  Keeping your weight evenly spread over the width of your feet, rise up on your toes.*  Hold this position for __________ seconds. Repeat __________ times. Complete this exercise __________ times per day.  *If this is too easy, shift your weight toward your right / left leg until you feel challenged. Ultimately, you may be asked to do this exercise with your right / left foot only. STRENGTH - Dorsiflexors and Plantar-flexors, Heel/toe Walking  Dorsiflexion: Walk on your heels only. Keep your toes as high as possible.  Walk for ____________________ seconds/feet.  Repeat __________ times. Complete __________ times per day.  Plantar flexion: Walk on your toes only. Keep your heels as high as possible.  Walk for ____________________ seconds/feet. Repeat __________ times. Complete __________ times per day.  BALANCE -  Tandem Walking  Place your uninjured foot on a line 2 to 4 inches wide and at least 10 feet long.  Keeping your balance without using anything for extra support, place your right / left heel directly in front of your other foot.  Slowly raise your back foot up, lifting from the heel to the toes, and place it directly in  front of the right / left foot.  Continue to walk along the line slowly. Walk for ____________________ feet. Repeat ____________________ times. Complete ____________________ times per day. BALANCE - Inversion/Eversion Use caution, these are advanced level exercises. Do not begin them until you are advised to do so.   Create a balance board using a sturdy board about 1  feet long and at 1 to 1  feet wide and a 1  inch diameter rod or pipe that is as long as the board's width. A copper pipe or a solid broomstick work well.  Stand on a non-carpeted surface near a countertop or wall. Step onto the board so that your feet are hip-width apart and equally straddle the rod/pipe.  Keeping your feet in place, complete these two exercises without shifting your upper body or hips:  Tip the board from side-to-side. Control the movement so the board does not forcefully strike the ground. The board should silently tap the ground.  Tip the board side-to-side without striking the ground. Occasionally pause and maintain a steady position at various points.  Repeat the first two exercises, but use only your right / left foot. Place your right / left foot directly over the rod/pipe. Repeat __________ times. Complete this exercise __________ times a day. BALANCE - Plantar/Dorsi Flexion Use caution, these are advanced level exercises. Do not begin them until you are advised to do so.   Create a balance board using a sturdy board about 1  feet long and at 1 to 1  feet wide and a 1  inch diameter rod or pipe that is as long as the board's width. A copper pipe or a solid broomstick work well.  Stand on a non-carpeted surface near a countertop or wall. Stand on the board so that the rod/pipe runs under the arches in your feet.  Keeping your feet in place, complete these two exercises without shifting your upper body or hips:  Tip the board from side-to-side. Control the movement so the board does not  forcefully strike the ground. The board should silently tap the ground.  Tip the board side-to-side without striking the ground. Occasionally pause and maintain a steady position at various points.  Repeat the first two exercises, but use only your right / left foot. Stand in the center of the board. Repeat __________ times. Complete this exercise __________ times a day. STRENGTH - Plantar-flexors, Eccentric Note: This exercise can place a lot of stress on your foot and ankle. Please complete this exercise only if specifically instructed by your caregiver.   Place the balls of your feet on a step. With your hands, use only enough support from a wall or rail to keep your balance.  Keep your knees straight and rise up on your toes.  Slowly shift your weight entirely to your toes and pick up your opposite foot. Gently and with controlled movement, lower your weight through your right / left foot so that your heel drops below the level of the step. You will feel a slight stretch in the back of your calf at the ending position.  Use the healthy  leg to help rise up onto the balls of both feet, then lower weight only on the right / left leg again. Build up to 15 repetitions. Then progress to 3 consecutive sets of 15 repetitions.*  After completing the above exercise, complete the same exercise with a slight knee bend (about 30 degrees). Again, build up to 15 repetitions. Then progress to 3 consecutive sets of 15 repetitions.* Perform this exercise __________ times per day.  *When you easily complete 3 sets of 15, your physician, physical therapist, or athletic trainer may advise you to add resistance by wearing a backpack filled with additional weight.   This information is not intended to replace advice given to you by your health care provider. Make sure you discuss any questions you have with your health care provider.   Document Released: 09/15/2005 Document Revised: 06/16/2014 Document Reviewed:  09/07/2008 Elsevier Interactive Patient Education [employer].

## 2016-02-27 NOTE — Assessment & Plan Note (Signed)
Symptoms and exam consistent with second-degree lateral ankle sprain and mild tendinopathy of the Achilles tendon most likely related to previous swelling. Continue conservative treatment with ice/, exercise therapy and over-the-counter nonsteroidal anti-inflammatories as needed. Recommend a lace up ankle brace for support as needed. Follow-up if symptoms worsen or do not improve.

## 2016-10-14 DIAGNOSIS — D239 Other benign neoplasm of skin, unspecified: Secondary | ICD-10-CM | POA: Diagnosis not present

## 2016-10-14 DIAGNOSIS — D225 Melanocytic nevi of trunk: Secondary | ICD-10-CM | POA: Diagnosis not present

## 2016-10-14 DIAGNOSIS — Z86018 Personal history of other benign neoplasm: Secondary | ICD-10-CM | POA: Diagnosis not present

## 2016-10-14 DIAGNOSIS — D1800 Hemangioma unspecified site: Secondary | ICD-10-CM | POA: Diagnosis not present

## 2016-10-21 ENCOUNTER — Other Ambulatory Visit: Payer: Self-pay | Admitting: Internal Medicine

## 2016-11-06 ENCOUNTER — Encounter: Payer: Managed Care, Other (non HMO) | Admitting: Internal Medicine

## 2016-11-23 ENCOUNTER — Encounter: Payer: Self-pay | Admitting: Internal Medicine

## 2016-11-23 NOTE — Patient Instructions (Addendum)
Test(s) ordered today. Your results will be released to MyChart (or called to you) after review, usually within 72hours after test completion. If any changes need to be made, you will be notified at that same time.  All other Health Maintenance issues reviewed.   All recommended immunizations and age-appropriate screenings are up-to-date or discussed.  No immunizations administered today.   Medications reviewed and updated.  No changes recommended at this time.   Please followup in one year    Health Maintenance, Male A healthy lifestyle and preventive care is important for your health and wellness. Ask your health care provider about what schedule of regular examinations is right for you. What should I know about weight and diet? Eat a Healthy Diet  Eat plenty of vegetables, fruits, whole grains, low-fat dairy products, and lean protein.  Do not eat a lot of foods high in solid fats, added sugars, or salt.  Maintain a Healthy Weight Regular exercise can help you achieve or maintain a healthy weight. You should:  Do at least 150 minutes of exercise each week. The exercise should increase your heart rate and make you sweat (moderate-intensity exercise).  Do strength-training exercises at least twice a week.  Watch Your Levels of Cholesterol and Blood Lipids  Have your blood tested for lipids and cholesterol every 5 years starting at 59 years of age. If you are at high risk for heart disease, you should start having your blood tested when you are 59 years old. You may need to have your cholesterol levels checked more often if: ? Your lipid or cholesterol levels are high. ? You are older than 59 years of age. ? You are at high risk for heart disease.  What should I know about cancer screening? Many types of cancers can be detected early and may often be prevented. Lung Cancer  You should be screened every year for lung cancer if: ? You are a current smoker who has smoked for  at least 30 years. ? You are a former smoker who has quit within the past 15 years.  Talk to your health care provider about your screening options, when you should start screening, and how often you should be screened.  Colorectal Cancer  Routine colorectal cancer screening usually begins at 59 years of age and should be repeated every 5-10 years until you are 59 years old. You may need to be screened more often if early forms of precancerous polyps or small growths are found. Your health care provider may recommend screening at an earlier age if you have risk factors for colon cancer.  Your health care provider may recommend using home test kits to check for hidden blood in the stool.  A small camera at the end of a tube can be used to examine your colon (sigmoidoscopy or colonoscopy). This checks for the earliest forms of colorectal cancer.  Prostate and Testicular Cancer  Depending on your age and overall health, your health care provider may do certain tests to screen for prostate and testicular cancer.  Talk to your health care provider about any symptoms or concerns you have about testicular or prostate cancer.  Skin Cancer  Check your skin from head to toe regularly.  Tell your health care provider about any new moles or changes in moles, especially if: ? There is a change in a mole's size, shape, or color. ? You have a mole that is larger than a pencil eraser.  Always use sunscreen. Apply sunscreen liberally   and repeat throughout the day.  Protect yourself by wearing long sleeves, pants, a wide-brimmed hat, and sunglasses when outside.  What should I know about heart disease, diabetes, and high blood pressure?  If you are 18-39 years of age, have your blood pressure checked every 3-5 years. If you are 40 years of age or older, have your blood pressure checked every year. You should have your blood pressure measured twice-once when you are at a hospital or clinic, and once  when you are not at a hospital or clinic. Record the average of the two measurements. To check your blood pressure when you are not at a hospital or clinic, you can use: ? An automated blood pressure machine at a pharmacy. ? A home blood pressure monitor.  Talk to your health care provider about your target blood pressure.  If you are between 45-79 years old, ask your health care provider if you should take aspirin to prevent heart disease.  Have regular diabetes screenings by checking your fasting blood sugar level. ? If you are at a normal weight and have a low risk for diabetes, have this test once every three years after the age of 45. ? If you are overweight and have a high risk for diabetes, consider being tested at a younger age or more often.  A one-time screening for abdominal aortic aneurysm (AAA) by ultrasound is recommended for men aged 65-75 years who are current or former smokers. What should I know about preventing infection? Hepatitis B If you have a higher risk for hepatitis B, you should be screened for this virus. Talk with your health care provider to find out if you are at risk for hepatitis B infection. Hepatitis C Blood testing is recommended for:  Everyone born from 1945 through 1965.  Anyone with known risk factors for hepatitis C.  Sexually Transmitted Diseases (STDs)  You should be screened each year for STDs including gonorrhea and chlamydia if: ? You are sexually active and are younger than 59 years of age. ? You are older than 59 years of age and your health care provider tells you that you are at risk for this type of infection. ? Your sexual activity has changed since you were last screened and you are at an increased risk for chlamydia or gonorrhea. Ask your health care provider if you are at risk.  Talk with your health care provider about whether you are at high risk of being infected with HIV. Your health care provider may recommend a prescription  medicine to help prevent HIV infection.  What else can I do?  Schedule regular health, dental, and eye exams.  Stay current with your vaccines (immunizations).  Do not use any tobacco products, such as cigarettes, chewing tobacco, and e-cigarettes. If you need help quitting, ask your health care provider.  Limit alcohol intake to no more than 2 drinks per day. One drink equals 12 ounces of beer, 5 ounces of wine, or 1 ounces of hard liquor.  Do not use street drugs.  Do not share needles.  Ask your health care provider for help if you need support or information about quitting drugs.  Tell your health care provider if you often feel depressed.  Tell your health care provider if you have ever been abused or do not feel safe at home. This information is not intended to replace advice given to you by your health care provider. Make sure you discuss any questions you have with your health   care provider. Document Released: 11/22/2007 Document Revised: 01/23/2016 Document Reviewed: 02/27/2015 Elsevier Interactive Patient Education  2018 Else[employer] Inc[employer]  

## 2016-11-23 NOTE — Progress Notes (Signed)
Subjective:    Patient ID: Timothy [name], male    DOB: Jun 13, 1955, 61 y.o.   MRN: [id number]  HPI He is here for a physical exam.   Tick bite on left arm two weeks ago.  It never got inflamed and denies any rash, but there is a small red mark that is still present.   He is not sleeping great. He gets about 7hrs.  He started melatonin and it has helped a little, but he wonders what else he can try.   When he is esating soup he does get some tremor in his right hand.  He thinks his dad and PGM also had a tremor but is not sure.  He denies a tremor in his left hand.  He denies a resting tremor.     Medications and allergies reviewed with patient and updated if appropriate.  Patient Active Problem List   Diagnosis Date Noted  . Ankle sprain 02/27/2016  . Sciatica of left side 10/11/2015  . Basal cell carcinoma 09/08/2014  . ED (erectile dysfunction) 05/28/2012  . Family history of ischemic heart disease 02/20/2012  . ELEVATED PROSTATE SPECIFIC ANTIGEN 10/11/2008  . Hypothyroidism 08/30/2007  . BPH (benign prostatic hyperplasia) 08/30/2007    Current Outpatient Prescriptions on File Prior to Visit  Medication Sig Dispense Refill  . cetirizine (ZYRTEC) 10 MG tablet Take 10 mg by mouth daily.    Marland Kitchen levothyroxine (SYNTHROID, LEVOTHROID) 150 MCG tablet TAKE 1 AND 1/2 TABLETS EVERY DAY EXCEPT TAKE 1 TABLET ON TUESDAY AND THURSDAY 114 tablet 0  . triamcinolone (NASACORT AQ) 55 MCG/ACT nasal inhaler 2 sprays by Nasal route daily.       No current facility-administered medications on file prior to visit.     Past Medical History:  Diagnosis Date  . Allergy    seasonal  . ED (erectile dysfunction)   . Gilbert syndrome   . Hypothyroidism     Past Surgical History:  Procedure Laterality Date  . APPENDECTOMY    . BASAL CELL CARCINOMA EXCISION  08/2014  . Benign Nevi      Removed x 3/ Dr. Crista Luria   . CARPAL TUNNEL RELEASE  5/12/20007   Bilateral ; Dr Fredna Dow  . COLONOSCOPY   2005 & 2011   Normal; [facility]  . TONSILLECTOMY      Social History   Social History  . Marital status: Married    Spouse name: N/A  . Number of children: N/A  . Years of education: N/A   Social History Main Topics  . Smoking status: Never Smoker  . Smokeless tobacco: Never Used  . Alcohol use 0.0 oz/week     Comment:  15 servings of beer or wine  . Drug use: No  . Sexual activity: Not Asked   Other Topics Concern  . None   Social History Narrative   Exercise - regular 5-7 times, cardio, weights    Family History  Problem Relation Age of Onset  . Hypothyroidism Father   . Colon polyps Father   . Heart attack Father 12       smoker  . Melanoma Father   . Lung cancer Father   . Hypothyroidism Mother   . Heart attack Mother 68       smoker  . Colonic polyp Mother   . Hypothyroidism Brother         X 3  . Melanoma Brother   . Diabetes Daughter  Type 1  . Stroke Neg Hx   . Colon cancer Neg Hx     Review of Systems  Constitutional: Negative for chills and fever.  Eyes: Negative for visual disturbance.  Respiratory: Negative for cough, shortness of breath and wheezing.   Cardiovascular: Negative for chest pain, palpitations and leg swelling.  Gastrointestinal: Negative for abdominal pain, blood in stool, constipation, diarrhea and nausea.       No gerd  Genitourinary: Positive for difficulty urinating (very occasional). Negative for dysuria and hematuria.  Musculoskeletal: Negative for arthralgias and back pain.  Skin: Negative for color change and rash.  Neurological: Negative for light-headedness and headaches.  Psychiatric/Behavioral: Positive for sleep disturbance. Negative for dysphoric mood. The patient is not nervous/anxious.        Objective:   Vitals:   11/25/16 0921  BP: 118/78  Pulse: (!) 45  Resp: 16  Temp: 97.8 F (36.6 C)   Filed Weights   11/25/16 0921  Weight: 197 lb (89.4 kg)   Body mass index is 25.99 kg/m.  Wt  Readings from Last 3 Encounters:  11/25/16 197 lb (89.4 kg)  02/27/16 195 lb (88.5 kg)  12/20/15 193 lb (87.5 kg)     Physical Exam Constitutional: He appears well-developed and well-nourished. No distress.  HENT:  Head: Normocephalic and atraumatic.  Right Ear: External ear normal.  Left Ear: External ear normal.  Mouth/Throat: Oropharynx is clear and moist.  Normal ear canals and TM b/l  Eyes: Conjunctivae and EOM are normal.  Neck: Neck supple. No tracheal deviation present. No thyromegaly present.  No carotid bruit  Cardiovascular: Normal rate, regular rhythm, normal heart sounds and intact distal pulses.   No murmur heard. Pulmonary/Chest: Effort normal and breath sounds normal. No respiratory distress. He has no wheezes. He has no rales.  Abdominal: Soft. Bowel sounds are normal. He exhibits no distension. There is no tenderness.  Genitourinary: deferred  Musculoskeletal: He exhibits no edema.  Lymphadenopathy:   He has no cervical adenopathy.  Skin: Skin is warm and dry. He is not diaphoretic.  Psychiatric: He has a normal mood and affect. His behavior is normal.         Assessment & Plan:   Physical exam: Screening blood work  ordered Immunizations   Up to date,  Discussed shingrix Colonoscopy   Up to date  Eye exams  Not up to date - will schedule EKG   Last done 2013 Exercise   regular Weight  Good for age Skin   Sees derm annually Substance abuse   none  See Problem List for Assessment and Plan of chronic medical problems.  FU annually

## 2016-11-23 NOTE — Assessment & Plan Note (Signed)
Check tsh  Titrate med dose if needed  

## 2016-11-25 ENCOUNTER — Other Ambulatory Visit: Payer: Self-pay | Admitting: Internal Medicine

## 2016-11-25 ENCOUNTER — Ambulatory Visit (INDEPENDENT_AMBULATORY_CARE_PROVIDER_SITE_OTHER): Payer: BLUE CROSS/BLUE SHIELD | Admitting: Internal Medicine

## 2016-11-25 ENCOUNTER — Encounter: Payer: Self-pay | Admitting: Internal Medicine

## 2016-11-25 ENCOUNTER — Other Ambulatory Visit (INDEPENDENT_AMBULATORY_CARE_PROVIDER_SITE_OTHER): Payer: BLUE CROSS/BLUE SHIELD

## 2016-11-25 VITALS — BP 118/78 | HR 45 | Temp 97.8°F | Resp 16 | Ht 73.0 in | Wt 197.0 lb

## 2016-11-25 DIAGNOSIS — E038 Other specified hypothyroidism: Secondary | ICD-10-CM

## 2016-11-25 DIAGNOSIS — N400 Enlarged prostate without lower urinary tract symptoms: Secondary | ICD-10-CM

## 2016-11-25 DIAGNOSIS — Z0000 Encounter for general adult medical examination without abnormal findings: Secondary | ICD-10-CM | POA: Diagnosis not present

## 2016-11-25 DIAGNOSIS — Z0001 Encounter for general adult medical examination with abnormal findings: Secondary | ICD-10-CM

## 2016-11-25 DIAGNOSIS — N529 Male erectile dysfunction, unspecified: Secondary | ICD-10-CM | POA: Diagnosis not present

## 2016-11-25 DIAGNOSIS — R972 Elevated prostate specific antigen [PSA]: Secondary | ICD-10-CM

## 2016-11-25 LAB — COMPREHENSIVE METABOLIC PANEL
ALK PHOS: 35 U/L — AB (ref 39–117)
ALT: 23 U/L (ref 0–53)
AST: 24 U/L (ref 0–37)
Albumin: 4.5 g/dL (ref 3.5–5.2)
BUN: 17 mg/dL (ref 6–23)
CO2: 26 mEq/L (ref 19–32)
Calcium: 9.5 mg/dL (ref 8.4–10.5)
Chloride: 104 mEq/L (ref 96–112)
Creatinine, Ser: 1.25 mg/dL (ref 0.40–1.50)
GFR: 62.78 mL/min (ref >60.00)
GLUCOSE: 94 mg/dL (ref 70–99)
Potassium: 4 mEq/L (ref 3.5–5.1)
Sodium: 139 mEq/L (ref 135–145)
TOTAL PROTEIN: 6.9 g/dL (ref 6.0–8.3)
Total Bilirubin: 0.7 mg/dL (ref 0.2–1.2)

## 2016-11-25 LAB — CBC WITH DIFFERENTIAL/PLATELET
BASOS PCT: 0.6 % (ref 0.0–3.0)
Basophils Absolute: 0 10*3/uL (ref 0.0–0.1)
EOS PCT: 3 % (ref 0.0–5.0)
Eosinophils Absolute: 0.2 10*3/uL (ref 0.0–0.7)
HCT: 43.5 % (ref 39.0–52.0)
Hemoglobin: 14.7 g/dL (ref 13.0–17.0)
LYMPHS ABS: 1.9 10*3/uL (ref 0.7–4.0)
Lymphocytes Relative: 35.6 % (ref 12.0–46.0)
MCHC: 33.8 g/dL (ref 30.0–36.0)
MCV: 94.4 fl (ref 78.0–100.0)
MONOS PCT: 10.6 % (ref 3.0–12.0)
Monocytes Absolute: 0.6 10*3/uL (ref 0.1–1.0)
NEUTROS ABS: 2.7 10*3/uL (ref 1.4–7.7)
NEUTROS PCT: 50.2 % (ref 43.0–77.0)
PLATELETS: 210 10*3/uL (ref 150.0–400.0)
RBC: 4.61 Mil/uL (ref 4.22–5.81)
RDW: 14 % (ref 11.5–15.5)
WBC: 5.3 10*3/uL (ref 4.0–10.5)

## 2016-11-25 LAB — LIPID PANEL
Cholesterol: 202 mg/dL — ABNORMAL HIGH (ref 0–200)
HDL: 88.5 mg/dL (ref >39.00)
LDL Cholesterol: 100 mg/dL — ABNORMAL HIGH (ref 0–99)
NONHDL: 113.94
Total CHOL/HDL Ratio: 2
Triglycerides: 71 mg/dL (ref 0.0–149.0)
VLDL: 14.2 mg/dL (ref 0.0–40.0)

## 2016-11-25 LAB — TSH: TSH: 4.48 u[IU]/mL (ref 0.35–4.50)

## 2016-11-25 MED ORDER — TADALAFIL 5 MG PO TABS: 5.0000 mg | ORAL_TABLET | Freq: Every day | ORAL | 3 refills | Status: DC

## 2016-11-25 NOTE — Assessment & Plan Note (Signed)
Check psa 

## 2016-11-25 NOTE — Assessment & Plan Note (Signed)
Very occasional difficulty initiating urination Symptoms minimal No change in medication needed On cialis daily

## 2016-11-25 NOTE — Assessment & Plan Note (Signed)
Continue cialis daily at 5 mg

## 2016-11-26 LAB — PSA, TOTAL AND FREE
PSA, % Free: 42 % (ref >25)
PSA, Free: 0.5 ng/mL
PSA, TOTAL: 1.2 ng/mL (ref <=4.0)

## 2016-11-27 ENCOUNTER — Encounter: Payer: Self-pay | Admitting: Internal Medicine

## 2016-12-16 ENCOUNTER — Other Ambulatory Visit: Payer: Self-pay | Admitting: Emergency Medicine

## 2016-12-16 MED ORDER — LEVOTHYROXINE SODIUM 150 MCG PO TABS: ORAL_TABLET | ORAL | 1 refills | Status: DC

## 2017-02-12 ENCOUNTER — Other Ambulatory Visit: Payer: Self-pay | Admitting: Chiropractic Medicine

## 2017-02-12 ENCOUNTER — Ambulatory Visit
Admission: RE | Admit: 2017-02-12 | Discharge: 2017-02-12 | Disposition: A | Discharge status: Home / Self Care | Payer: BLUE CROSS/BLUE SHIELD | Source: Ambulatory Visit | Attending: Chiropractic Medicine | Admitting: Chiropractic Medicine

## 2017-02-12 DIAGNOSIS — M791 Myalgia: Secondary | ICD-10-CM | POA: Diagnosis not present

## 2017-02-12 DIAGNOSIS — M25511 Pain in right shoulder: Secondary | ICD-10-CM | POA: Diagnosis not present

## 2017-02-12 DIAGNOSIS — M19011 Primary osteoarthritis, right shoulder: Secondary | ICD-10-CM | POA: Diagnosis not present

## 2017-02-12 DIAGNOSIS — M25512 Pain in left shoulder: Secondary | ICD-10-CM | POA: Diagnosis not present

## 2017-02-12 DIAGNOSIS — M7541 Impingement syndrome of right shoulder: Secondary | ICD-10-CM | POA: Diagnosis not present

## 2017-02-12 DIAGNOSIS — M7542 Impingement syndrome of left shoulder: Secondary | ICD-10-CM | POA: Diagnosis not present

## 2017-02-19 DIAGNOSIS — M7542 Impingement syndrome of left shoulder: Secondary | ICD-10-CM | POA: Diagnosis not present

## 2017-02-19 DIAGNOSIS — M19011 Primary osteoarthritis, right shoulder: Secondary | ICD-10-CM | POA: Diagnosis not present

## 2017-02-19 DIAGNOSIS — M7541 Impingement syndrome of right shoulder: Secondary | ICD-10-CM | POA: Diagnosis not present

## 2017-02-19 DIAGNOSIS — M791 Myalgia: Secondary | ICD-10-CM | POA: Diagnosis not present

## 2017-02-26 DIAGNOSIS — M7541 Impingement syndrome of right shoulder: Secondary | ICD-10-CM | POA: Diagnosis not present

## 2017-02-26 DIAGNOSIS — M7542 Impingement syndrome of left shoulder: Secondary | ICD-10-CM | POA: Diagnosis not present

## 2017-02-26 DIAGNOSIS — M19011 Primary osteoarthritis, right shoulder: Secondary | ICD-10-CM | POA: Diagnosis not present

## 2017-02-26 DIAGNOSIS — M791 Myalgia: Secondary | ICD-10-CM | POA: Diagnosis not present

## 2017-03-05 DIAGNOSIS — M7541 Impingement syndrome of right shoulder: Secondary | ICD-10-CM | POA: Diagnosis not present

## 2017-03-05 DIAGNOSIS — M7542 Impingement syndrome of left shoulder: Secondary | ICD-10-CM | POA: Diagnosis not present

## 2017-03-05 DIAGNOSIS — M19011 Primary osteoarthritis, right shoulder: Secondary | ICD-10-CM | POA: Diagnosis not present

## 2017-03-05 DIAGNOSIS — M791 Myalgia: Secondary | ICD-10-CM | POA: Diagnosis not present

## 2017-03-12 DIAGNOSIS — M19011 Primary osteoarthritis, right shoulder: Secondary | ICD-10-CM | POA: Diagnosis not present

## 2017-03-12 DIAGNOSIS — M7541 Impingement syndrome of right shoulder: Secondary | ICD-10-CM | POA: Diagnosis not present

## 2017-03-12 DIAGNOSIS — M7918 Myalgia, other site: Secondary | ICD-10-CM | POA: Diagnosis not present

## 2017-03-12 DIAGNOSIS — M7542 Impingement syndrome of left shoulder: Secondary | ICD-10-CM | POA: Diagnosis not present

## 2017-03-19 DIAGNOSIS — M7541 Impingement syndrome of right shoulder: Secondary | ICD-10-CM | POA: Diagnosis not present

## 2017-03-19 DIAGNOSIS — M7918 Myalgia, other site: Secondary | ICD-10-CM | POA: Diagnosis not present

## 2017-03-19 DIAGNOSIS — M7542 Impingement syndrome of left shoulder: Secondary | ICD-10-CM | POA: Diagnosis not present

## 2017-03-19 DIAGNOSIS — M19011 Primary osteoarthritis, right shoulder: Secondary | ICD-10-CM | POA: Diagnosis not present

## 2017-03-27 DIAGNOSIS — M7918 Myalgia, other site: Secondary | ICD-10-CM | POA: Diagnosis not present

## 2017-03-27 DIAGNOSIS — M19011 Primary osteoarthritis, right shoulder: Secondary | ICD-10-CM | POA: Diagnosis not present

## 2017-03-27 DIAGNOSIS — M7541 Impingement syndrome of right shoulder: Secondary | ICD-10-CM | POA: Diagnosis not present

## 2017-03-27 DIAGNOSIS — M7542 Impingement syndrome of left shoulder: Secondary | ICD-10-CM | POA: Diagnosis not present

## 2017-04-07 DIAGNOSIS — M7542 Impingement syndrome of left shoulder: Secondary | ICD-10-CM | POA: Diagnosis not present

## 2017-04-07 DIAGNOSIS — M7541 Impingement syndrome of right shoulder: Secondary | ICD-10-CM | POA: Diagnosis not present

## 2017-04-07 DIAGNOSIS — M19011 Primary osteoarthritis, right shoulder: Secondary | ICD-10-CM | POA: Diagnosis not present

## 2017-04-07 DIAGNOSIS — M7918 Myalgia, other site: Secondary | ICD-10-CM | POA: Diagnosis not present

## 2017-04-20 DIAGNOSIS — M19011 Primary osteoarthritis, right shoulder: Secondary | ICD-10-CM | POA: Diagnosis not present

## 2017-04-20 DIAGNOSIS — M7542 Impingement syndrome of left shoulder: Secondary | ICD-10-CM | POA: Diagnosis not present

## 2017-04-20 DIAGNOSIS — M7541 Impingement syndrome of right shoulder: Secondary | ICD-10-CM | POA: Diagnosis not present

## 2017-04-20 DIAGNOSIS — M7918 Myalgia, other site: Secondary | ICD-10-CM | POA: Diagnosis not present

## 2017-04-28 DIAGNOSIS — M7542 Impingement syndrome of left shoulder: Secondary | ICD-10-CM | POA: Diagnosis not present

## 2017-04-28 DIAGNOSIS — M7918 Myalgia, other site: Secondary | ICD-10-CM | POA: Diagnosis not present

## 2017-04-28 DIAGNOSIS — M7541 Impingement syndrome of right shoulder: Secondary | ICD-10-CM | POA: Diagnosis not present

## 2017-04-28 DIAGNOSIS — M19011 Primary osteoarthritis, right shoulder: Secondary | ICD-10-CM | POA: Diagnosis not present

## 2017-05-14 DIAGNOSIS — G8929 Other chronic pain: Secondary | ICD-10-CM | POA: Diagnosis not present

## 2017-05-14 DIAGNOSIS — M7542 Impingement syndrome of left shoulder: Secondary | ICD-10-CM | POA: Diagnosis not present

## 2017-05-14 DIAGNOSIS — M25511 Pain in right shoulder: Secondary | ICD-10-CM | POA: Diagnosis not present

## 2017-05-14 DIAGNOSIS — M25512 Pain in left shoulder: Secondary | ICD-10-CM

## 2017-05-14 DIAGNOSIS — M7541 Impingement syndrome of right shoulder: Secondary | ICD-10-CM | POA: Diagnosis not present

## 2017-05-19 DIAGNOSIS — M7542 Impingement syndrome of left shoulder: Secondary | ICD-10-CM | POA: Diagnosis not present

## 2017-05-19 DIAGNOSIS — M7918 Myalgia, other site: Secondary | ICD-10-CM | POA: Diagnosis not present

## 2017-05-19 DIAGNOSIS — M19011 Primary osteoarthritis, right shoulder: Secondary | ICD-10-CM | POA: Diagnosis not present

## 2017-05-19 DIAGNOSIS — M7541 Impingement syndrome of right shoulder: Secondary | ICD-10-CM | POA: Diagnosis not present

## 2017-05-26 ENCOUNTER — Other Ambulatory Visit: Payer: Self-pay | Admitting: Internal Medicine

## 2017-06-23 ENCOUNTER — Other Ambulatory Visit: Payer: Self-pay | Admitting: Orthopedic Surgery

## 2017-06-23 DIAGNOSIS — M7540 Impingement syndrome of unspecified shoulder: Secondary | ICD-10-CM

## 2017-06-23 DIAGNOSIS — M25512 Pain in left shoulder: Principal | ICD-10-CM

## 2017-06-23 DIAGNOSIS — M25511 Pain in right shoulder: Secondary | ICD-10-CM

## 2017-06-28 ENCOUNTER — Ambulatory Visit
Admission: RE | Admit: 2017-06-28 | Discharge: 2017-06-28 | Disposition: A | Discharge status: Home / Self Care | Payer: BLUE CROSS/BLUE SHIELD | Source: Ambulatory Visit | Attending: Orthopedic Surgery | Admitting: Orthopedic Surgery

## 2017-06-28 DIAGNOSIS — M25511 Pain in right shoulder: Secondary | ICD-10-CM

## 2017-06-28 DIAGNOSIS — M19012 Primary osteoarthritis, left shoulder: Secondary | ICD-10-CM | POA: Diagnosis not present

## 2017-06-28 DIAGNOSIS — M25512 Pain in left shoulder: Principal | ICD-10-CM

## 2017-06-28 DIAGNOSIS — M7540 Impingement syndrome of unspecified shoulder: Secondary | ICD-10-CM

## 2017-07-02 DIAGNOSIS — M7541 Impingement syndrome of right shoulder: Secondary | ICD-10-CM | POA: Diagnosis not present

## 2017-07-02 DIAGNOSIS — M7542 Impingement syndrome of left shoulder: Secondary | ICD-10-CM | POA: Diagnosis not present

## 2017-07-02 DIAGNOSIS — G8929 Other chronic pain: Secondary | ICD-10-CM | POA: Diagnosis not present

## 2017-07-15 DIAGNOSIS — M19012 Primary osteoarthritis, left shoulder: Secondary | ICD-10-CM | POA: Diagnosis not present

## 2017-07-15 DIAGNOSIS — G8918 Other acute postprocedural pain: Secondary | ICD-10-CM | POA: Diagnosis not present

## 2017-07-15 DIAGNOSIS — M94212 Chondromalacia, left shoulder: Secondary | ICD-10-CM | POA: Diagnosis not present

## 2017-07-15 DIAGNOSIS — M7542 Impingement syndrome of left shoulder: Secondary | ICD-10-CM | POA: Diagnosis not present

## 2017-07-15 DIAGNOSIS — S43432A Superior glenoid labrum lesion of left shoulder, initial encounter: Secondary | ICD-10-CM | POA: Diagnosis not present

## 2017-07-21 DIAGNOSIS — Z9889 Other specified postprocedural states: Secondary | ICD-10-CM | POA: Insufficient documentation

## 2017-07-23 DIAGNOSIS — G8929 Other chronic pain: Secondary | ICD-10-CM | POA: Diagnosis not present

## 2017-07-23 DIAGNOSIS — M25511 Pain in right shoulder: Secondary | ICD-10-CM | POA: Diagnosis not present

## 2017-07-23 DIAGNOSIS — M25612 Stiffness of left shoulder, not elsewhere classified: Secondary | ICD-10-CM | POA: Diagnosis not present

## 2017-07-23 DIAGNOSIS — M25512 Pain in left shoulder: Secondary | ICD-10-CM | POA: Diagnosis not present

## 2017-07-30 DIAGNOSIS — M7542 Impingement syndrome of left shoulder: Secondary | ICD-10-CM | POA: Diagnosis not present

## 2017-07-30 DIAGNOSIS — M25511 Pain in right shoulder: Secondary | ICD-10-CM | POA: Diagnosis not present

## 2017-07-30 DIAGNOSIS — Z9889 Other specified postprocedural states: Secondary | ICD-10-CM | POA: Diagnosis not present

## 2017-07-30 DIAGNOSIS — M25612 Stiffness of left shoulder, not elsewhere classified: Secondary | ICD-10-CM | POA: Diagnosis not present

## 2017-08-06 DIAGNOSIS — M25612 Stiffness of left shoulder, not elsewhere classified: Secondary | ICD-10-CM | POA: Diagnosis not present

## 2017-08-06 DIAGNOSIS — M25511 Pain in right shoulder: Secondary | ICD-10-CM | POA: Diagnosis not present

## 2017-08-06 DIAGNOSIS — M7542 Impingement syndrome of left shoulder: Secondary | ICD-10-CM | POA: Diagnosis not present

## 2017-08-06 DIAGNOSIS — Z9889 Other specified postprocedural states: Secondary | ICD-10-CM | POA: Diagnosis not present

## 2017-08-11 DIAGNOSIS — M25511 Pain in right shoulder: Secondary | ICD-10-CM | POA: Diagnosis not present

## 2017-08-11 DIAGNOSIS — M7542 Impingement syndrome of left shoulder: Secondary | ICD-10-CM | POA: Diagnosis not present

## 2017-08-11 DIAGNOSIS — M25612 Stiffness of left shoulder, not elsewhere classified: Secondary | ICD-10-CM | POA: Diagnosis not present

## 2017-08-11 DIAGNOSIS — Z9889 Other specified postprocedural states: Secondary | ICD-10-CM | POA: Diagnosis not present

## 2017-08-18 DIAGNOSIS — Z9889 Other specified postprocedural states: Secondary | ICD-10-CM | POA: Diagnosis not present

## 2017-08-18 DIAGNOSIS — M25612 Stiffness of left shoulder, not elsewhere classified: Secondary | ICD-10-CM | POA: Diagnosis not present

## 2017-08-18 DIAGNOSIS — M25511 Pain in right shoulder: Secondary | ICD-10-CM | POA: Diagnosis not present

## 2017-08-18 DIAGNOSIS — M7542 Impingement syndrome of left shoulder: Secondary | ICD-10-CM | POA: Diagnosis not present

## 2017-08-27 DIAGNOSIS — M7502 Adhesive capsulitis of left shoulder: Secondary | ICD-10-CM | POA: Diagnosis not present

## 2017-08-27 DIAGNOSIS — M25511 Pain in right shoulder: Secondary | ICD-10-CM | POA: Diagnosis not present

## 2017-08-27 DIAGNOSIS — M7542 Impingement syndrome of left shoulder: Secondary | ICD-10-CM | POA: Diagnosis not present

## 2017-08-27 DIAGNOSIS — M25612 Stiffness of left shoulder, not elsewhere classified: Secondary | ICD-10-CM | POA: Diagnosis not present

## 2017-09-04 DIAGNOSIS — M25511 Pain in right shoulder: Secondary | ICD-10-CM | POA: Diagnosis not present

## 2017-09-04 DIAGNOSIS — M7542 Impingement syndrome of left shoulder: Secondary | ICD-10-CM | POA: Diagnosis not present

## 2017-09-04 DIAGNOSIS — M25612 Stiffness of left shoulder, not elsewhere classified: Secondary | ICD-10-CM | POA: Diagnosis not present

## 2017-09-04 DIAGNOSIS — M7502 Adhesive capsulitis of left shoulder: Secondary | ICD-10-CM | POA: Diagnosis not present

## 2017-09-10 DIAGNOSIS — M25612 Stiffness of left shoulder, not elsewhere classified: Secondary | ICD-10-CM | POA: Diagnosis not present

## 2017-09-10 DIAGNOSIS — M25511 Pain in right shoulder: Secondary | ICD-10-CM | POA: Diagnosis not present

## 2017-09-10 DIAGNOSIS — M7502 Adhesive capsulitis of left shoulder: Secondary | ICD-10-CM | POA: Diagnosis not present

## 2017-09-10 DIAGNOSIS — M7542 Impingement syndrome of left shoulder: Secondary | ICD-10-CM | POA: Diagnosis not present

## 2017-09-15 DIAGNOSIS — M7502 Adhesive capsulitis of left shoulder: Secondary | ICD-10-CM | POA: Diagnosis not present

## 2017-09-15 DIAGNOSIS — M7542 Impingement syndrome of left shoulder: Secondary | ICD-10-CM | POA: Diagnosis not present

## 2017-09-15 DIAGNOSIS — M25612 Stiffness of left shoulder, not elsewhere classified: Secondary | ICD-10-CM | POA: Diagnosis not present

## 2017-09-15 DIAGNOSIS — M25511 Pain in right shoulder: Secondary | ICD-10-CM | POA: Diagnosis not present

## 2017-09-24 DIAGNOSIS — M25511 Pain in right shoulder: Secondary | ICD-10-CM | POA: Diagnosis not present

## 2017-09-24 DIAGNOSIS — M7502 Adhesive capsulitis of left shoulder: Secondary | ICD-10-CM | POA: Diagnosis not present

## 2017-09-24 DIAGNOSIS — M7542 Impingement syndrome of left shoulder: Secondary | ICD-10-CM | POA: Diagnosis not present

## 2017-09-24 DIAGNOSIS — M25612 Stiffness of left shoulder, not elsewhere classified: Secondary | ICD-10-CM | POA: Diagnosis not present

## 2017-10-08 DIAGNOSIS — M7542 Impingement syndrome of left shoulder: Secondary | ICD-10-CM | POA: Diagnosis not present

## 2017-10-08 DIAGNOSIS — M25612 Stiffness of left shoulder, not elsewhere classified: Secondary | ICD-10-CM | POA: Diagnosis not present

## 2017-10-08 DIAGNOSIS — Z9889 Other specified postprocedural states: Secondary | ICD-10-CM | POA: Diagnosis not present

## 2017-10-08 DIAGNOSIS — M25511 Pain in right shoulder: Secondary | ICD-10-CM | POA: Diagnosis not present

## 2017-10-22 DIAGNOSIS — M25612 Stiffness of left shoulder, not elsewhere classified: Secondary | ICD-10-CM | POA: Diagnosis not present

## 2017-10-22 DIAGNOSIS — M7542 Impingement syndrome of left shoulder: Secondary | ICD-10-CM | POA: Diagnosis not present

## 2017-10-22 DIAGNOSIS — M7502 Adhesive capsulitis of left shoulder: Secondary | ICD-10-CM | POA: Diagnosis not present

## 2017-10-22 DIAGNOSIS — M25511 Pain in right shoulder: Secondary | ICD-10-CM | POA: Diagnosis not present

## 2017-10-27 DIAGNOSIS — D485 Neoplasm of uncertain behavior of skin: Secondary | ICD-10-CM | POA: Diagnosis not present

## 2017-10-27 DIAGNOSIS — D239 Other benign neoplasm of skin, unspecified: Secondary | ICD-10-CM | POA: Diagnosis not present

## 2017-10-27 DIAGNOSIS — Z86018 Personal history of other benign neoplasm: Secondary | ICD-10-CM | POA: Diagnosis not present

## 2017-10-27 DIAGNOSIS — D225 Melanocytic nevi of trunk: Secondary | ICD-10-CM | POA: Diagnosis not present

## 2017-11-20 ENCOUNTER — Other Ambulatory Visit: Payer: Self-pay | Admitting: Internal Medicine

## 2018-01-02 NOTE — Patient Instructions (Addendum)
Test(s) ordered today. Your results will be released to MyChart (or called to you) after review, usually within 72hours after test completion. If any changes need to be made, you will be notified at that same time.  All other Health Maintenance issues reviewed.   All recommended immunizations and age-appropriate screenings are up-to-date or discussed.  No immunizations administered today.   Medications reviewed and updated.  No changes recommended at this time.   Please followup in one year    Health Maintenance, Male A healthy lifestyle and preventive care is important for your health and wellness. Ask your health care provider about what schedule of regular examinations is right for you. What should I know about weight and diet? Eat a Healthy Diet  Eat plenty of vegetables, fruits, whole grains, low-fat dairy products, and lean protein.  Do not eat a lot of foods high in solid fats, added sugars, or salt.  Maintain a Healthy Weight Regular exercise can help you achieve or maintain a healthy weight. You should:  Do at least 150 minutes of exercise each week. The exercise should increase your heart rate and make you sweat (moderate-intensity exercise).  Do strength-training exercises at least twice a week.  Watch Your Levels of Cholesterol and Blood Lipids  Have your blood tested for lipids and cholesterol every 5 years starting at 60 years of age. If you are at high risk for heart disease, you should start having your blood tested when you are 60 years old. You may need to have your cholesterol levels checked more often if: ? Your lipid or cholesterol levels are high. ? You are older than 60 years of age. ? You are at high risk for heart disease.  What should I know about cancer screening? Many types of cancers can be detected early and may often be prevented. Lung Cancer  You should be screened every year for lung cancer if: ? You are a current smoker who has smoked for  at least 30 years. ? You are a former smoker who has quit within the past 15 years.  Talk to your health care provider about your screening options, when you should start screening, and how often you should be screened.  Colorectal Cancer  Routine colorectal cancer screening usually begins at 60 years of age and should be repeated every 5-10 years until you are 60 years old. You may need to be screened more often if early forms of precancerous polyps or small growths are found. Your health care provider may recommend screening at an earlier age if you have risk factors for colon cancer.  Your health care provider may recommend using home test kits to check for hidden blood in the stool.  A small camera at the end of a tube can be used to examine your colon (sigmoidoscopy or colonoscopy). This checks for the earliest forms of colorectal cancer.  Prostate and Testicular Cancer  Depending on your age and overall health, your health care provider may do certain tests to screen for prostate and testicular cancer.  Talk to your health care provider about any symptoms or concerns you have about testicular or prostate cancer.  Skin Cancer  Check your skin from head to toe regularly.  Tell your health care provider about any new moles or changes in moles, especially if: ? There is a change in a mole's size, shape, or color. ? You have a mole that is larger than a pencil eraser.  Always use sunscreen. Apply sunscreen liberally   and repeat throughout the day.  Protect yourself by wearing long sleeves, pants, a wide-brimmed hat, and sunglasses when outside.  What should I know about heart disease, diabetes, and high blood pressure?  If you are 18-39 years of age, have your blood pressure checked every 3-5 years. If you are 40 years of age or older, have your blood pressure checked every year. You should have your blood pressure measured twice-once when you are at a hospital or clinic, and once  when you are not at a hospital or clinic. Record the average of the two measurements. To check your blood pressure when you are not at a hospital or clinic, you can use: ? An automated blood pressure machine at a pharmacy. ? A home blood pressure monitor.  Talk to your health care provider about your target blood pressure.  If you are between 45-79 years old, ask your health care provider if you should take aspirin to prevent heart disease.  Have regular diabetes screenings by checking your fasting blood sugar level. ? If you are at a normal weight and have a low risk for diabetes, have this test once every three years after the age of 45. ? If you are overweight and have a high risk for diabetes, consider being tested at a younger age or more often.  A one-time screening for abdominal aortic aneurysm (AAA) by ultrasound is recommended for men aged 65-75 years who are current or former smokers. What should I know about preventing infection? Hepatitis B If you have a higher risk for hepatitis B, you should be screened for this virus. Talk with your health care provider to find out if you are at risk for hepatitis B infection. Hepatitis C Blood testing is recommended for:  Everyone born from 1945 through 1965.  Anyone with known risk factors for hepatitis C.  Sexually Transmitted Diseases (STDs)  You should be screened each year for STDs including gonorrhea and chlamydia if: ? You are sexually active and are younger than 60 years of age. ? You are older than 60 years of age and your health care provider tells you that you are at risk for this type of infection. ? Your sexual activity has changed since you were last screened and you are at an increased risk for chlamydia or gonorrhea. Ask your health care provider if you are at risk.  Talk with your health care provider about whether you are at high risk of being infected with HIV. Your health care provider may recommend a prescription  medicine to help prevent HIV infection.  What else can I do?  Schedule regular health, dental, and eye exams.  Stay current with your vaccines (immunizations).  Do not use any tobacco products, such as cigarettes, chewing tobacco, and e-cigarettes. If you need help quitting, ask your health care provider.  Limit alcohol intake to no more than 2 drinks per day. One drink equals 12 ounces of beer, 5 ounces of wine, or 1 ounces of hard liquor.  Do not use street drugs.  Do not share needles.  Ask your health care provider for help if you need support or information about quitting drugs.  Tell your health care provider if you often feel depressed.  Tell your health care provider if you have ever been abused or do not feel safe at home. This information is not intended to replace advice given to you by your health care provider. Make sure you discuss any questions you have with your health   care provider. Document Released: 11/22/2007 Document Revised: 01/23/2016 Document Reviewed: 02/27/2015 Elsevier Interactive Patient Education  2018 [employer]  

## 2018-01-02 NOTE — Progress Notes (Addendum)
Subjective:    Patient ID: Timothy [name], male    DOB: Oct 23, 1955, 62 y.o.   MRN: [id number]  HPI He is here for a physical exam.   He has no concerns.  He had left shoulder surgery since he was here and will have right shoulder surgery this year as well for the same problem -- acromial decompression.  Medications and allergies reviewed with patient and updated if appropriate.  Patient Active Problem List   Diagnosis Date Noted  . S/P arthroscopy of left shoulder 07/21/2017  . Chronic pain of both shoulders 05/14/2017  . Sciatica of left side 10/11/2015  . History of basal cell carcinoma (BCC) 09/08/2014  . ED (erectile dysfunction) 05/28/2012  . Family history of ischemic heart disease 02/20/2012  . ELEVATED PROSTATE SPECIFIC ANTIGEN 10/11/2008  . Hypothyroidism 08/30/2007  . BPH (benign prostatic hyperplasia) 08/30/2007    Current Outpatient Medications on File Prior to Visit  Medication Sig Dispense Refill  . cetirizine (ZYRTEC) 10 MG tablet Take 10 mg by mouth daily.    Marland Kitchen levothyroxine (SYNTHROID, LEVOTHROID) 150 MCG tablet TAKE 1 AND 1/2 TABLETS EVERY DAY EXCEPT TAKE 1 TABLET ON TUESDAY AND THURSDAY 114 tablet 0  . tadalafil (CIALIS) 5 MG tablet Take 1 tablet (5 mg total) by mouth daily. 90 tablet 3  . triamcinolone (NASACORT AQ) 55 MCG/ACT nasal inhaler 2 sprays by Nasal route daily.       No current facility-administered medications on file prior to visit.     Past Medical History:  Diagnosis Date  . Allergy    seasonal  . ED (erectile dysfunction)   . Gilbert syndrome   . Hypothyroidism     Past Surgical History:  Procedure Laterality Date  . APPENDECTOMY    . BASAL CELL CARCINOMA EXCISION  08/2014  . Benign Nevi      Removed x 3/ Dr. Crista Luria   . CARPAL TUNNEL RELEASE  5/12/20007   Bilateral ; Dr Fredna Dow  . COLONOSCOPY  2005 & 2011   Normal;  GI  . TONSILLECTOMY      Social History   Socioeconomic History  . Marital status: Married   Spouse name: Not on file  . Number of children: Not on file  . Years of education: Not on file  . Highest education level: Not on file  Occupational History  . Not on file  Social Needs  . Financial resource strain: Not on file  . Food insecurity:    Worry: Not on file    Inability: Not on file  . Transportation needs:    Medical: Not on file    Non-medical: Not on file  Tobacco Use  . Smoking status: Never Smoker  . Smokeless tobacco: Never Used  Substance and Sexual Activity  . Alcohol use: Yes    Alcohol/week: 0.0 oz    Comment:  15 servings of beer or wine  . Drug use: No  . Sexual activity: Not on file  Lifestyle  . Physical activity:    Days per week: Not on file    Minutes per session: Not on file  . Stress: Not on file  Relationships  . Social connections:    Talks on phone: Not on file    Gets together: Not on file    Attends religious service: Not on file    Active member of club or organization: Not on file    Attends meetings of clubs or organizations: Not on file  Relationship status: Not on file  Other Topics Concern  . Not on file  Social History Narrative   Exercise - regular 5-7 times, cardio, weights    Family History  Problem Relation Age of Onset  . Hypothyroidism Father   . Colon polyps Father   . Heart attack Father 25       smoker  . Melanoma Father   . Lung cancer Father   . Hypothyroidism Mother   . Heart attack Mother 3       smoker  . Colonic polyp Mother   . Hypothyroidism Brother         X 3  . Melanoma Brother   . Diabetes Daughter        Type 1  . Stroke Neg Hx   . Colon cancer Neg Hx     Review of Systems  Constitutional: Negative for chills and fever.  Eyes: Negative for visual disturbance.  Respiratory: Negative for cough, shortness of breath and wheezing.   Cardiovascular: Negative for chest pain, palpitations and leg swelling.  Gastrointestinal: Negative for abdominal pain, blood in stool, constipation,  diarrhea and nausea.       No gerd  Genitourinary: Negative for difficulty urinating, dysuria and hematuria.  Musculoskeletal: Positive for arthralgias (shoulders).  Skin: Negative for color change and rash.  Neurological: Negative for dizziness, light-headedness and headaches.  Psychiatric/Behavioral: Negative for dysphoric mood. The patient is not nervous/anxious.        Objective:   Vitals:   01/04/18 0805  BP: 128/86  Pulse: (!) 39  Resp: 16  Temp: 97.9 F (36.6 C)  SpO2: 98%   Filed Weights   01/04/18 0805  Weight: 201 lb (91.2 kg)   Body mass index is 26.52 kg/m.  Wt Readings from Last 3 Encounters:  01/04/18 201 lb (91.2 kg)  11/25/16 197 lb (89.4 kg)  02/27/16 195 lb (88.5 kg)     Physical Exam Constitutional: He appears well-developed and well-nourished. No distress.  HENT:  Head: Normocephalic and atraumatic.  Right Ear: External ear normal.  Left Ear: External ear normal.  Mouth/Throat: Oropharynx is clear and moist.  Normal ear canals and TM b/l  Eyes: Conjunctivae and EOM are normal.  Neck: Neck supple. No tracheal deviation present. No thyromegaly present.  No carotid bruit  Cardiovascular: mild bradycardia - estimated in the 40's, regular rhythm, normal heart sounds and intact distal pulses.  No murmur heard. Pulmonary/Chest: Effort normal and breath sounds normal. No respiratory distress. He has no wheezes. He has no rales.  Abdominal: Soft. Small upper umbilical hernia, reducible, non-tender, He exhibits no distension. There is no tenderness.  Genitourinary: deferred  Musculoskeletal: He exhibits no edema.  Lymphadenopathy:   He has no cervical adenopathy.  Skin: Skin is warm and dry. He is not diaphoretic.  Psychiatric: He has a normal mood and affect. His behavior is normal.         Assessment & Plan:   Physical exam: Screening blood work ordered Immunizations   Up to date, discussed shingrix Colonoscopy  Up to date  Eye exams  - has  not had one in years - no change in vision - advised routine eye exam EKG   Last done 2013 Exercise  Regular - cardio and weights Weight  Good BMI  Skin no concerns - sees derm  Substance abuse   none  See Problem List for Assessment and Plan of chronic medical problems.    FU in one year

## 2018-01-04 ENCOUNTER — Ambulatory Visit (INDEPENDENT_AMBULATORY_CARE_PROVIDER_SITE_OTHER): Payer: BLUE CROSS/BLUE SHIELD | Admitting: Internal Medicine

## 2018-01-04 ENCOUNTER — Encounter: Payer: Self-pay | Admitting: Internal Medicine

## 2018-01-04 ENCOUNTER — Other Ambulatory Visit (INDEPENDENT_AMBULATORY_CARE_PROVIDER_SITE_OTHER): Payer: BLUE CROSS/BLUE SHIELD

## 2018-01-04 VITALS — BP 128/86 | HR 39 | Temp 97.9°F | Resp 16 | Ht 73.0 in | Wt 201.0 lb

## 2018-01-04 DIAGNOSIS — K429 Umbilical hernia without obstruction or gangrene: Secondary | ICD-10-CM

## 2018-01-04 DIAGNOSIS — Z0000 Encounter for general adult medical examination without abnormal findings: Secondary | ICD-10-CM

## 2018-01-04 DIAGNOSIS — E038 Other specified hypothyroidism: Secondary | ICD-10-CM

## 2018-01-04 DIAGNOSIS — N529 Male erectile dysfunction, unspecified: Secondary | ICD-10-CM

## 2018-01-04 DIAGNOSIS — R972 Elevated prostate specific antigen [PSA]: Secondary | ICD-10-CM | POA: Diagnosis not present

## 2018-01-04 DIAGNOSIS — Z85828 Personal history of other malignant neoplasm of skin: Secondary | ICD-10-CM

## 2018-01-04 DIAGNOSIS — Z0001 Encounter for general adult medical examination with abnormal findings: Secondary | ICD-10-CM

## 2018-01-04 DIAGNOSIS — N400 Enlarged prostate without lower urinary tract symptoms: Secondary | ICD-10-CM

## 2018-01-04 LAB — CBC WITH DIFFERENTIAL/PLATELET
BASOS ABS: 0 10*3/uL (ref 0.0–0.1)
Basophils Relative: 0.6 % (ref 0.0–3.0)
EOS PCT: 7.7 % — AB (ref 0.0–5.0)
Eosinophils Absolute: 0.4 10*3/uL (ref 0.0–0.7)
HEMATOCRIT: 43.7 % (ref 39.0–52.0)
Hemoglobin: 14.7 g/dL (ref 13.0–17.0)
LYMPHS PCT: 41.7 % (ref 12.0–46.0)
Lymphs Abs: 2.2 10*3/uL (ref 0.7–4.0)
MCHC: 33.6 g/dL (ref 30.0–36.0)
MCV: 94.8 fl (ref 78.0–100.0)
Monocytes Absolute: 0.6 10*3/uL (ref 0.1–1.0)
Monocytes Relative: 11.7 % (ref 3.0–12.0)
NEUTROS ABS: 2.1 10*3/uL (ref 1.4–7.7)
Neutrophils Relative %: 38.3 % — ABNORMAL LOW (ref 43.0–77.0)
PLATELETS: 183 10*3/uL (ref 150.0–400.0)
RBC: 4.62 Mil/uL (ref 4.22–5.81)
RDW: 14.2 % (ref 11.5–15.5)
WBC: 5.4 10*3/uL (ref 4.0–10.5)

## 2018-01-04 LAB — COMPREHENSIVE METABOLIC PANEL
ALBUMIN: 4.3 g/dL (ref 3.5–5.2)
ALT: 20 U/L (ref 0–53)
AST: 25 U/L (ref 0–37)
Alkaline Phosphatase: 35 U/L — ABNORMAL LOW (ref 39–117)
BUN: 20 mg/dL (ref 6–23)
CO2: 25 meq/L (ref 19–32)
Calcium: 9.7 mg/dL (ref 8.4–10.5)
Chloride: 104 mEq/L (ref 96–112)
Creatinine, Ser: 1.42 mg/dL (ref 0.40–1.50)
GFR: 53.99 mL/min — ABNORMAL LOW (ref >60.00)
Glucose, Bld: 90 mg/dL (ref 70–99)
Potassium: 4.2 mEq/L (ref 3.5–5.1)
Sodium: 142 mEq/L (ref 135–145)
Total Bilirubin: 0.5 mg/dL (ref 0.2–1.2)
Total Protein: 7.1 g/dL (ref 6.0–8.3)

## 2018-01-04 LAB — LIPID PANEL
CHOL/HDL RATIO: 2
Cholesterol: 205 mg/dL — ABNORMAL HIGH (ref 0–200)
HDL: 83.6 mg/dL (ref >39.00)
LDL Cholesterol: 105 mg/dL — ABNORMAL HIGH (ref 0–99)
NONHDL: 121.33
Triglycerides: 83 mg/dL (ref 0.0–149.0)
VLDL: 16.6 mg/dL (ref 0.0–40.0)

## 2018-01-04 LAB — TSH: TSH: 3.56 u[IU]/mL (ref 0.35–4.50)

## 2018-01-04 NOTE — Assessment & Plan Note (Signed)
Sees derm annually 

## 2018-01-04 NOTE — Assessment & Plan Note (Signed)
Ck psa Asymptomatic - BPH

## 2018-01-04 NOTE — Assessment & Plan Note (Signed)
Check tsh  Titrate med dose if needed  

## 2018-01-04 NOTE — Assessment & Plan Note (Signed)
Small, minimal symptoms - mild tenderness at times Monitor, no need for surgery Discussed symptoms of incarceration

## 2018-01-04 NOTE — Assessment & Plan Note (Signed)
Asymptomatic Continue cialis 5 mg daily

## 2018-01-04 NOTE — Assessment & Plan Note (Signed)
Continue cialis 5 mg daily.  

## 2018-01-05 LAB — PSA, TOTAL AND FREE
PSA, % FREE: 32 % (ref >25)
PSA, FREE: 0.6 ng/mL
PSA, TOTAL: 1.9 ng/mL (ref <=4.0)

## 2018-01-08 ENCOUNTER — Encounter: Payer: Self-pay | Admitting: Internal Medicine

## 2018-01-26 ENCOUNTER — Ambulatory Visit (INDEPENDENT_AMBULATORY_CARE_PROVIDER_SITE_OTHER): Payer: BLUE CROSS/BLUE SHIELD

## 2018-01-26 DIAGNOSIS — Z299 Encounter for prophylactic measures, unspecified: Secondary | ICD-10-CM | POA: Diagnosis not present

## 2018-02-14 ENCOUNTER — Other Ambulatory Visit: Payer: Self-pay | Admitting: Internal Medicine

## 2018-03-03 DIAGNOSIS — G8918 Other acute postprocedural pain: Secondary | ICD-10-CM | POA: Diagnosis not present

## 2018-03-03 DIAGNOSIS — M19011 Primary osteoarthritis, right shoulder: Secondary | ICD-10-CM | POA: Diagnosis not present

## 2018-03-03 DIAGNOSIS — M75111 Incomplete rotator cuff tear or rupture of right shoulder, not specified as traumatic: Secondary | ICD-10-CM | POA: Diagnosis not present

## 2018-03-03 DIAGNOSIS — M24111 Other articular cartilage disorders, right shoulder: Secondary | ICD-10-CM | POA: Diagnosis not present

## 2018-03-03 DIAGNOSIS — M7541 Impingement syndrome of right shoulder: Secondary | ICD-10-CM | POA: Diagnosis not present

## 2018-03-03 DIAGNOSIS — M66811 Spontaneous rupture of other tendons, right shoulder: Secondary | ICD-10-CM | POA: Diagnosis not present

## 2018-03-04 DIAGNOSIS — R6889 Other general symptoms and signs: Secondary | ICD-10-CM | POA: Diagnosis not present

## 2018-03-04 DIAGNOSIS — Z9889 Other specified postprocedural states: Secondary | ICD-10-CM | POA: Diagnosis not present

## 2018-03-04 DIAGNOSIS — M7541 Impingement syndrome of right shoulder: Secondary | ICD-10-CM | POA: Diagnosis not present

## 2018-03-04 DIAGNOSIS — M25611 Stiffness of right shoulder, not elsewhere classified: Secondary | ICD-10-CM | POA: Diagnosis not present

## 2018-03-11 DIAGNOSIS — Z9889 Other specified postprocedural states: Secondary | ICD-10-CM | POA: Diagnosis not present

## 2018-03-11 DIAGNOSIS — R6889 Other general symptoms and signs: Secondary | ICD-10-CM | POA: Diagnosis not present

## 2018-03-11 DIAGNOSIS — M7541 Impingement syndrome of right shoulder: Secondary | ICD-10-CM | POA: Diagnosis not present

## 2018-03-11 DIAGNOSIS — M25611 Stiffness of right shoulder, not elsewhere classified: Secondary | ICD-10-CM | POA: Diagnosis not present

## 2018-03-16 DIAGNOSIS — H2513 Age-related nuclear cataract, bilateral: Secondary | ICD-10-CM | POA: Diagnosis not present

## 2018-03-18 DIAGNOSIS — Z9889 Other specified postprocedural states: Secondary | ICD-10-CM | POA: Diagnosis not present

## 2018-03-18 DIAGNOSIS — R6889 Other general symptoms and signs: Secondary | ICD-10-CM | POA: Diagnosis not present

## 2018-03-18 DIAGNOSIS — M25611 Stiffness of right shoulder, not elsewhere classified: Secondary | ICD-10-CM | POA: Diagnosis not present

## 2018-03-18 DIAGNOSIS — M7541 Impingement syndrome of right shoulder: Secondary | ICD-10-CM | POA: Diagnosis not present

## 2018-03-25 DIAGNOSIS — R6889 Other general symptoms and signs: Secondary | ICD-10-CM | POA: Diagnosis not present

## 2018-03-25 DIAGNOSIS — Z9889 Other specified postprocedural states: Secondary | ICD-10-CM | POA: Diagnosis not present

## 2018-03-25 DIAGNOSIS — M25611 Stiffness of right shoulder, not elsewhere classified: Secondary | ICD-10-CM | POA: Diagnosis not present

## 2018-04-01 DIAGNOSIS — M7541 Impingement syndrome of right shoulder: Secondary | ICD-10-CM | POA: Diagnosis not present

## 2018-04-01 DIAGNOSIS — Z9889 Other specified postprocedural states: Secondary | ICD-10-CM | POA: Diagnosis not present

## 2018-04-01 DIAGNOSIS — R6889 Other general symptoms and signs: Secondary | ICD-10-CM | POA: Diagnosis not present

## 2018-04-01 DIAGNOSIS — M25611 Stiffness of right shoulder, not elsewhere classified: Secondary | ICD-10-CM | POA: Diagnosis not present

## 2018-04-08 ENCOUNTER — Ambulatory Visit (INDEPENDENT_AMBULATORY_CARE_PROVIDER_SITE_OTHER): Payer: BLUE CROSS/BLUE SHIELD

## 2018-04-08 DIAGNOSIS — Z23 Encounter for immunization: Secondary | ICD-10-CM

## 2018-04-08 DIAGNOSIS — Z299 Encounter for prophylactic measures, unspecified: Secondary | ICD-10-CM

## 2018-05-05 ENCOUNTER — Ambulatory Visit (INDEPENDENT_AMBULATORY_CARE_PROVIDER_SITE_OTHER): Payer: PRIVATE HEALTH INSURANCE | Admitting: Emergency Medicine

## 2018-05-05 DIAGNOSIS — Z23 Encounter for immunization: Secondary | ICD-10-CM

## 2018-05-14 ENCOUNTER — Other Ambulatory Visit: Payer: Self-pay | Admitting: Internal Medicine

## 2018-11-03 ENCOUNTER — Other Ambulatory Visit: Payer: Self-pay | Admitting: Internal Medicine

## 2018-11-18 ENCOUNTER — Encounter: Payer: Self-pay | Admitting: Internal Medicine

## 2018-11-18 ENCOUNTER — Other Ambulatory Visit: Payer: Self-pay | Admitting: *Deleted

## 2018-11-18 MED ORDER — LEVOTHYROXINE SODIUM 150 MCG PO TABS: ORAL_TABLET | ORAL | 1 refills | Status: DC

## 2018-11-18 NOTE — Telephone Encounter (Signed)
Pt sent email needing refill on his Levothyroxine. Inform pt will need CPX in July. [facility] enough [facility] appt.Marland KitchenJohny [name]

## 2018-12-24 ENCOUNTER — Other Ambulatory Visit: Payer: Self-pay | Admitting: Internal Medicine

## 2019-01-18 NOTE — Patient Instructions (Addendum)
Tests ordered today. Your results will be released to [place] (or called to you) after review.  If any changes need to be made, you will be notified at that same time.  All other Health Maintenance issues reviewed.   All recommended immunizations and age-appropriate screenings are up-to-date or discussed.  No immunization administered today.   Medications reviewed and updated.  Changes include :   cialis 10 -20 mg as needed.   Your prescription(s) have been submitted to your pharmacy. Please take as directed and contact our office if you believe you are having problem(s) with the medication(s).   Please followup in 1 year   Health Maintenance, Male Adopting a healthy lifestyle and getting preventive care are important in promoting health and wellness. Ask your health care provider about:  The right schedule for you to have regular tests and exams.  Things you can do on your own to prevent diseases and keep yourself healthy. What should I know about diet, weight, and exercise? Eat a healthy diet   Eat a diet that includes plenty of vegetables, fruits, low-fat dairy products, and lean protein.  Do not eat a lot of foods that are high in solid fats, added sugars, or sodium. Maintain a healthy weight Body mass index (BMI) is a measurement that can be used to identify possible weight problems. It estimates body fat based on height and weight. Your health care provider can help determine your BMI and help you achieve or maintain a healthy weight. Get regular exercise Get regular exercise. This is one of the most important things you can do for your health. Most adults should:  Exercise for at least 150 minutes each week. The exercise should increase your heart rate and make you sweat (moderate-intensity exercise).  Do strengthening exercises at least twice a week. This is in addition to the moderate-intensity exercise.  Spend less time sitting. Even light physical activity can be  beneficial. Watch cholesterol and blood lipids Have your blood tested for lipids and cholesterol at 61 years of age, then have this test every 5 years. You may need to have your cholesterol levels checked more often if:  Your lipid or cholesterol levels are high.  You are older than 61 years of age.  You are at high risk for heart disease. What should I know about cancer screening? Many types of cancers can be detected early and may often be prevented. Depending on your health history and family history, you may need to have cancer screening at various ages. This may include screening for:  Colorectal cancer.  Prostate cancer.  Skin cancer.  Lung cancer. What should I know about heart disease, diabetes, and high blood pressure? Blood pressure and heart disease  High blood pressure causes heart disease and increases the risk of stroke. This is more likely to develop in people who have high blood pressure readings, are of African descent, or are overweight.  Talk with your health care provider about your target blood pressure readings.  Have your blood pressure checked: ? Every 3-5 years if you are 20-45 years of age. ? Every year if you are 75 years old or older.  If you are between the ages of 22 and 61 and are a current or former smoker, ask your health care provider if you should have a one-time screening for abdominal aortic aneurysm (AAA). Diabetes Have regular diabetes screenings. This checks your fasting blood sugar level. Have the screening done:  Once every three years after age  45 if you are at a normal weight and have a low risk for diabetes.  More often and at a younger age if you are overweight or have a high risk for diabetes. What should I know about preventing infection? Hepatitis B If you have a higher risk for hepatitis B, you should be screened for this virus. Talk with your health care provider to find out if you are at risk for hepatitis B infection.  Hepatitis C Blood testing is recommended for:  Everyone born from [birth year] through 1965.  Anyone with known risk factors for hepatitis C. Sexually transmitted infections (STIs)  You should be screened each year for STIs, including gonorrhea and chlamydia, if: ? You are sexually active and are younger than 61 years of age. ? You are older than 61 years of age and your health care provider tells you that you are at risk for this type of infection. ? Your sexual activity has changed since you were last screened, and you are at increased risk for chlamydia or gonorrhea. Ask your health care provider if you are at risk.  Ask your health care provider about whether you are at high risk for HIV. Your health care provider may recommend a prescription medicine to help prevent HIV infection. If you choose to take medicine to prevent HIV, you should first get tested for HIV. You should then be tested every 3 months for as long as you are taking the medicine. Follow these instructions at home: Lifestyle  Do not use any products that contain nicotine or tobacco, such as cigarettes, e-cigarettes, and chewing tobacco. If you need help quitting, ask your health care provider.  Do not use street drugs.  Do not share needles.  Ask your health care provider for help if you need support or information about quitting drugs. Alcohol use  Do not drink alcohol if your health care provider tells you not to drink.  If you drink alcohol: ? Limit how much you have to 0-2 drinks a day. ? Be aware of how much alcohol is in your drink. In the [place] one drink equals one 12 oz bottle of beer (355 mL), one 5 oz glass of wine (148 mL), or one 1 oz glass of hard liquor (44 mL). General instructions  Schedule regular health, dental, and eye exams.  Stay current with your vaccines.  Tell your health care provider if: ? You often feel depressed. ? You have ever been abused or do not feel safe at home. Summary   Adopting a healthy lifestyle and getting preventive care are important in promoting health and wellness.  Follow your health care provider's instructions about healthy diet, exercising, and getting tested or screened for diseases.  Follow your health care provider's instructions on monitoring your cholesterol and blood pressure. This information is not intended to replace advice given to you by your health care provider. Make sure you discuss any questions you have with your health care provider. Document Released: 11/22/2007 Document Revised: 05/19/2018 Document Reviewed: 05/19/2018 Elsevier Patient Education  2020 [employer].

## 2019-01-18 NOTE — Progress Notes (Signed)
Subjective:    Patient ID: Timothy [name], male    DOB: [birth year], 63 y.o.   MRN: [id number]  HPI He is here for a physical exam.   He is taking taking the cialis daily.  He is interested in trying something as needed.      Medications and allergies reviewed with patient and updated if appropriate.  Patient Active Problem List   Diagnosis Date Noted  . Umbilical hernia without obstruction or gangrene 01/04/2018  . S/P arthroscopy of left shoulder 07/21/2017  . Chronic pain of both shoulders 05/14/2017  . Sciatica of left side 10/11/2015  . History of basal cell carcinoma (BCC) 09/08/2014  . ED (erectile dysfunction) 05/28/2012  . Family history of ischemic heart disease 02/20/2012  . ELEVATED PROSTATE SPECIFIC ANTIGEN 10/11/2008  . Hypothyroidism 08/30/2007  . BPH (benign prostatic hyperplasia) 08/30/2007    Current Outpatient Medications on File Prior to Visit  Medication Sig Dispense Refill  . cetirizine (ZYRTEC) 10 MG tablet Take 10 mg by mouth daily.    Marland Kitchen levothyroxine (SYNTHROID) 150 MCG tablet TAKE 1 AND 1/2 TABLETS EVERY DAY EXCEPT TAKE 1 TABLET ON TUESDAY AND THURSDAY 38 tablet 0  . triamcinolone (NASACORT AQ) 55 MCG/ACT nasal inhaler 2 sprays by Nasal route daily.       No current facility-administered medications on file prior to visit.     Past Medical History:  Diagnosis Date  . Allergy    seasonal  . ED (erectile dysfunction)   . Gilbert syndrome   . Hypothyroidism     Past Surgical History:  Procedure Laterality Date  . APPENDECTOMY    . BASAL CELL CARCINOMA EXCISION  08/2014  . Benign Nevi      Removed x 3/ Dr. Crista Luria   . CARPAL TUNNEL RELEASE  5/12/20007   Bilateral ; Dr Fredna Dow  . COLONOSCOPY  2005 & 2011   Normal; [facility]  . TONSILLECTOMY      Social History   Socioeconomic History  . Marital status: Married    Spouse name: Not on file  . Number of children: Not on file  . Years of education: Not on file  . Highest education  level: Not on file  Occupational History  . Not on file  Social Needs  . Financial resource strain: Not on file  . Food insecurity    Worry: Not on file    Inability: Not on file  . Transportation needs    Medical: Not on file    Non-medical: Not on file  Tobacco Use  . Smoking status: Never Smoker  . Smokeless tobacco: Never Used  Substance and Sexual Activity  . Alcohol use: Yes    Alcohol/week: 0.0 standard drinks    Comment:  15 servings of beer or wine  . Drug use: No  . Sexual activity: Not on file  Lifestyle  . Physical activity    Days per week: Not on file    Minutes per session: Not on file  . Stress: Not on file  Relationships  . Social Herbalist on phone: Not on file    Gets together: Not on file    Attends religious service: Not on file    Active member of club or organization: Not on file    Attends meetings of clubs or organizations: Not on file    Relationship status: Not on file  Other Topics Concern  . Not on file  Social History Narrative  Exercise - regular 5-7 times, cardio, weights    Family History  Problem Relation Age of Onset  . Hypothyroidism Father   . Colon polyps Father   . Heart attack Father 80       smoker  . Melanoma Father   . Lung cancer Father   . Hypothyroidism Mother   . Heart attack Mother 87       smoker  . Colonic polyp Mother   . Hypothyroidism Brother         X 3  . Melanoma Brother   . Diabetes Daughter        Type 1  . Stroke Neg Hx   . Colon cancer Neg Hx     Review of Systems  Constitutional: Negative for chills and fever.  Eyes: Negative for visual disturbance.  Respiratory: Negative for cough, shortness of breath and wheezing.   Cardiovascular: Negative for chest pain, palpitations and leg swelling.  Gastrointestinal: Negative for abdominal pain, blood in stool, constipation, diarrhea and nausea.       No gerd  Genitourinary: Negative for difficulty urinating, dysuria and hematuria.   Musculoskeletal: Negative for arthralgias and back pain.  Skin: Negative for color change and rash.  Neurological: Negative for dizziness, light-headedness and headaches.  Psychiatric/Behavioral: Negative for dysphoric mood. The patient is not nervous/anxious.        Objective:   Vitals:   01/19/19 0804  BP: 126/80  Pulse: (!) 48  Resp: 16  Temp: 97.8 F (36.6 C)  SpO2: 97%   Filed Weights   01/19/19 0804  Weight: 191 lb (86.6 kg)   Body mass index is 25.2 kg/m.  Wt Readings from Last 3 Encounters:  01/19/19 191 lb (86.6 kg)  01/04/18 201 lb (91.2 kg)  11/25/16 197 lb (89.4 kg)     Physical Exam Constitutional: He appears well-developed and well-nourished. No distress.  HENT:  Head: Normocephalic and atraumatic.  Right Ear: External ear normal.  Left Ear: External ear normal.  Mouth/Throat: Oropharynx is clear and moist.  Normal ear canals and TM b/l  Eyes: Conjunctivae and EOM are normal.  Neck: Neck supple. No tracheal deviation present. No thyromegaly present.  No carotid bruit  Cardiovascular: Normal rate, regular rhythm, normal heart sounds and intact distal pulses.   No murmur heard. Pulmonary/Chest: Effort normal and breath sounds normal. No respiratory distress. He has no wheezes. He has no rales.  Abdominal: Soft. He exhibits no distension. There is no tenderness.  Genitourinary: deferred  Musculoskeletal: He exhibits no edema.  Lymphadenopathy:   He has no cervical adenopathy.  Skin: Skin is warm and dry. He is not diaphoretic.  Psychiatric: He has a normal mood and affect. His behavior is normal.         Assessment & Plan:   Physical exam: Screening blood work   ordered Immunizations    Up to date  Colonoscopy   Up to date  Eye exams   Up to date  Exercise   Since pandemic less - running, biking, yoga Weight   Normal BMI Skin   Sees derm annually Substance abuse  none  See Problem List for Assessment and Plan of chronic medical problems.    FU in one year

## 2019-01-19 ENCOUNTER — Encounter: Payer: Self-pay | Admitting: Internal Medicine

## 2019-01-19 ENCOUNTER — Other Ambulatory Visit: Payer: Self-pay

## 2019-01-19 ENCOUNTER — Ambulatory Visit (INDEPENDENT_AMBULATORY_CARE_PROVIDER_SITE_OTHER): Payer: PRIVATE HEALTH INSURANCE | Admitting: Internal Medicine

## 2019-01-19 ENCOUNTER — Other Ambulatory Visit (INDEPENDENT_AMBULATORY_CARE_PROVIDER_SITE_OTHER): Payer: PRIVATE HEALTH INSURANCE

## 2019-01-19 VITALS — BP 126/80 | HR 48 | Temp 97.8°F | Resp 16 | Wt 191.0 lb

## 2019-01-19 DIAGNOSIS — K429 Umbilical hernia without obstruction or gangrene: Secondary | ICD-10-CM

## 2019-01-19 DIAGNOSIS — Z85828 Personal history of other malignant neoplasm of skin: Secondary | ICD-10-CM

## 2019-01-19 DIAGNOSIS — N529 Male erectile dysfunction, unspecified: Secondary | ICD-10-CM

## 2019-01-19 DIAGNOSIS — E038 Other specified hypothyroidism: Secondary | ICD-10-CM

## 2019-01-19 DIAGNOSIS — Z0000 Encounter for general adult medical examination without abnormal findings: Secondary | ICD-10-CM | POA: Diagnosis not present

## 2019-01-19 DIAGNOSIS — N400 Enlarged prostate without lower urinary tract symptoms: Secondary | ICD-10-CM | POA: Diagnosis not present

## 2019-01-19 DIAGNOSIS — Z1159 Encounter for screening for other viral diseases: Secondary | ICD-10-CM

## 2019-01-19 LAB — COMPREHENSIVE METABOLIC PANEL
ALT: 23 U/L (ref 0–53)
AST: 22 U/L (ref 0–37)
Albumin: 4.4 g/dL (ref 3.5–5.2)
Alkaline Phosphatase: 43 U/L (ref 39–117)
BUN: 15 mg/dL (ref 6–23)
CO2: 27 mEq/L (ref 19–32)
Calcium: 9.6 mg/dL (ref 8.4–10.5)
Chloride: 106 mEq/L (ref 96–112)
Creatinine, Ser: 1.2 mg/dL (ref 0.40–1.50)
GFR: 61.47 mL/min (ref >60.00)
Glucose, Bld: 95 mg/dL (ref 70–99)
Potassium: 4.5 mEq/L (ref 3.5–5.1)
Sodium: 141 mEq/L (ref 135–145)
Total Bilirubin: 0.7 mg/dL (ref 0.2–1.2)
Total Protein: 6.8 g/dL (ref 6.0–8.3)

## 2019-01-19 LAB — CBC WITH DIFFERENTIAL/PLATELET
Basophils Absolute: 0 10*3/uL (ref 0.0–0.1)
Basophils Relative: 0.6 % (ref 0.0–3.0)
Eosinophils Absolute: 0.3 10*3/uL (ref 0.0–0.7)
Eosinophils Relative: 6.7 % — ABNORMAL HIGH (ref 0.0–5.0)
HCT: 44 % (ref 39.0–52.0)
Hemoglobin: 14.7 g/dL (ref 13.0–17.0)
Lymphocytes Relative: 37.7 % (ref 12.0–46.0)
Lymphs Abs: 1.7 10*3/uL (ref 0.7–4.0)
MCHC: 33.3 g/dL (ref 30.0–36.0)
MCV: 95.5 fl (ref 78.0–100.0)
Monocytes Absolute: 0.5 10*3/uL (ref 0.1–1.0)
Monocytes Relative: 10.2 % (ref 3.0–12.0)
Neutro Abs: 2.1 10*3/uL (ref 1.4–7.7)
Neutrophils Relative %: 44.8 % (ref 43.0–77.0)
Platelets: 198 10*3/uL (ref 150.0–400.0)
RBC: 4.6 Mil/uL (ref 4.22–5.81)
RDW: 13.9 % (ref 11.5–15.5)
WBC: 4.6 10*3/uL (ref 4.0–10.5)

## 2019-01-19 LAB — LIPID PANEL
Cholesterol: 190 mg/dL (ref 0–200)
HDL: 85.3 mg/dL (ref >39.00)
LDL Cholesterol: 90 mg/dL (ref 0–99)
NonHDL: 104.2
Total CHOL/HDL Ratio: 2
Triglycerides: 69 mg/dL (ref 0.0–149.0)
VLDL: 13.8 mg/dL (ref 0.0–40.0)

## 2019-01-19 LAB — TSH: TSH: 1.25 u[IU]/mL (ref 0.35–4.50)

## 2019-01-19 MED ORDER — TADALAFIL 20 MG PO TABS: 10.0000 mg | ORAL_TABLET | ORAL | 11 refills | Status: DC | PRN

## 2019-01-19 MED ORDER — LEVOTHYROXINE SODIUM 150 MCG PO TABS: ORAL_TABLET | ORAL | 3 refills | Status: DC

## 2019-01-19 NOTE — Assessment & Plan Note (Signed)
Sees dermatology.

## 2019-01-19 NOTE — Assessment & Plan Note (Signed)
Generic cialis prn

## 2019-01-19 NOTE — Assessment & Plan Note (Signed)
Occasional pain, symptoms not worse Continue to monitor

## 2019-01-19 NOTE — Assessment & Plan Note (Addendum)
Asymptomatic  Not taking cialis daily - would like to use prn

## 2019-01-19 NOTE — Addendum Note (Signed)
Addended by: Binnie Rail on: 01/19/2019 08:51 AM   Modules accepted: Orders

## 2019-01-19 NOTE — Assessment & Plan Note (Signed)
Clinically euthyroid Check tsh  Titrate med dose if needed  

## 2019-01-20 ENCOUNTER — Encounter: Payer: Self-pay | Admitting: Internal Medicine

## 2019-01-20 LAB — PSA, TOTAL AND FREE
PSA, % Free: 29 % (calc) (ref >25)
PSA, Free: 0.5 ng/mL
PSA, Total: 1.7 ng/mL (ref <=4.0)

## 2019-01-24 ENCOUNTER — Encounter: Payer: Self-pay | Admitting: Internal Medicine

## 2019-01-24 LAB — SAR COV2 SEROLOGY (COVID19)AB(IGG),IA: SARS CoV2 AB IGG: NEGATIVE

## 2019-02-01 ENCOUNTER — Encounter: Payer: Self-pay | Admitting: Internal Medicine

## 2019-09-12 ENCOUNTER — Ambulatory Visit: Payer: PRIVATE HEALTH INSURANCE | Attending: Internal Medicine

## 2019-09-12 DIAGNOSIS — Z23 Encounter for immunization: Secondary | ICD-10-CM

## 2019-09-12 NOTE — Progress Notes (Signed)
   Covid-19 Vaccination Clinic  Name:  Timothy [name]    MRN: [id number] DOB: [birth year]  09/12/2019  [name] was observed post Covid-19 immunization for 15 minutes without incident. He was provided with Vaccine Information Sheet and instruction to access the V-Safe system.   [name] was instructed to call 911 with any severe reactions post vaccine: Marland Kitchen Difficulty breathing  . Swelling of face and throat  . A fast heartbeat  . A bad rash all over body  . Dizziness and weakness   Immunizations Administered    Name Date Dose VIS Date Route   Pfizer COVID-19 Vaccine 09/12/2019  2:26 PM 0.3 mL 05/20/2019 Intramuscular   Manufacturer: [place]   Lot: [id number]   [place]: [id number]

## 2019-10-11 ENCOUNTER — Ambulatory Visit: Payer: PRIVATE HEALTH INSURANCE | Attending: Internal Medicine

## 2019-10-11 DIAGNOSIS — Z23 Encounter for immunization: Secondary | ICD-10-CM

## 2019-10-11 NOTE — Progress Notes (Signed)
   Covid-19 Vaccination Clinic  Name:  Timothy [name]    MRN: [id number] DOB: 02/25/1956  10/11/2019  [name] was observed post Covid-19 immunization for 15 minutes without incident. He was provided with Vaccine Information Sheet and instruction to access the V-Safe system.   [name] was instructed to call 911 with any severe reactions post vaccine: Marland Kitchen Difficulty breathing  . Swelling of face and throat  . A fast heartbeat  . A bad rash all over body  . Dizziness and weakness   Immunizations Administered    Name Date Dose VIS Date Route   Pfizer COVID-19 Vaccine 10/11/2019  8:31 AM 0.3 mL 08/03/2018 Intramuscular   Manufacturer: [place]   Lot: [id number]   [place]: [id number]

## 2019-11-28 IMAGING — MR MR SHOULDER*R* W/O CM
4 of 5 series · 25 of 40 positions shown · non-contrast
Comparison: Radiograph 02/12/2017.

CLINICAL DATA: Bilateral shoulder pain. Right shoulder pain is
chronic. Shoulder injection several months ago without significant
relief. No previous relevant surgery.

EXAM:
MRI OF THE RIGHT SHOULDER WITHOUT CONTRAST
TECHNIQUE: Multiplanar, multisequence MR imaging of the shoulder was performed.
No intravenous contrast was administered.

[Series 3: T2 fat-sat · axial · 4.0mm · 0.55mm/px · z∈[-48,+55]mm · 8 of 23 slices shown (1 of 3)]
[im 1/23]
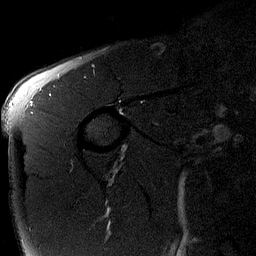
[im 4/23]
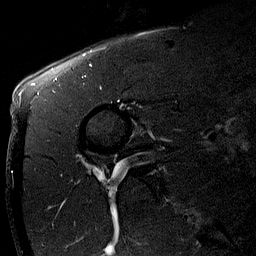
[im 7/23]
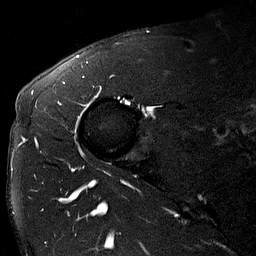
[im 10/23]
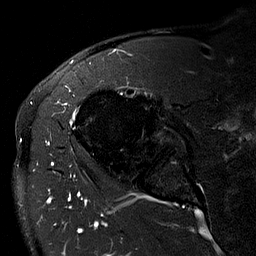
[im 13/23]
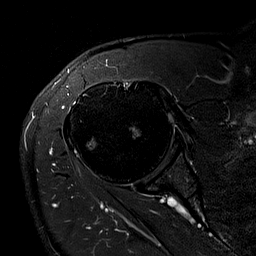
[im 16/23]
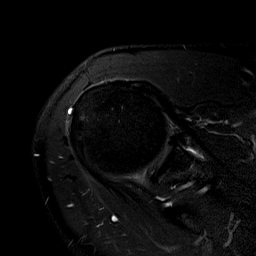
[im 19/23]
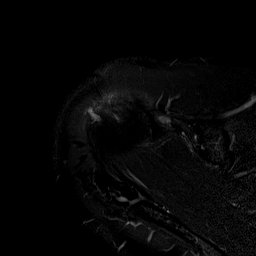
[im 23/23]
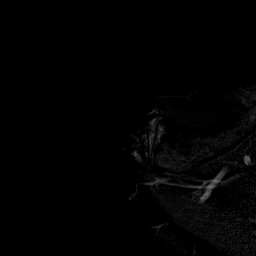

[Series 4: T2 fat-sat · oblique · 4.0mm · 0.55mm/px · 6 of 22 slices shown (2 of 3)]
[im 1/22]
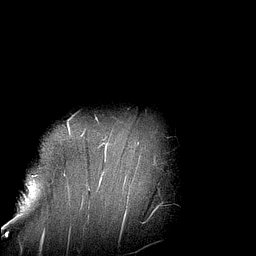
[im 4/22]
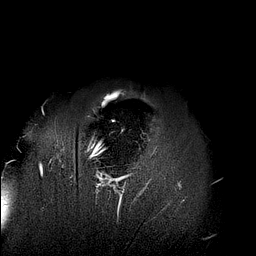
[im 7/22]
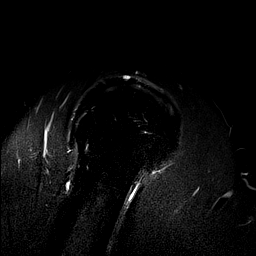
[im 10/22]
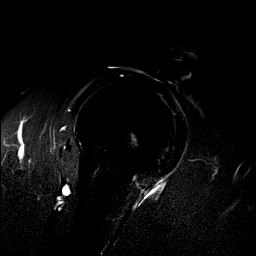
[im 13/22]
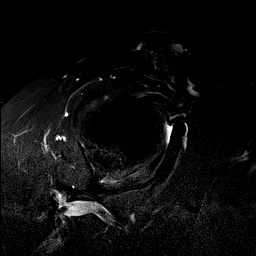
[im 19/22]
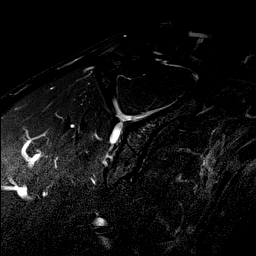

[Series 6: T2 fat-sat · oblique · 4.0mm · 0.27mm/px · 3 of 21 slices shown (3 of 3)]
[im 3/21]
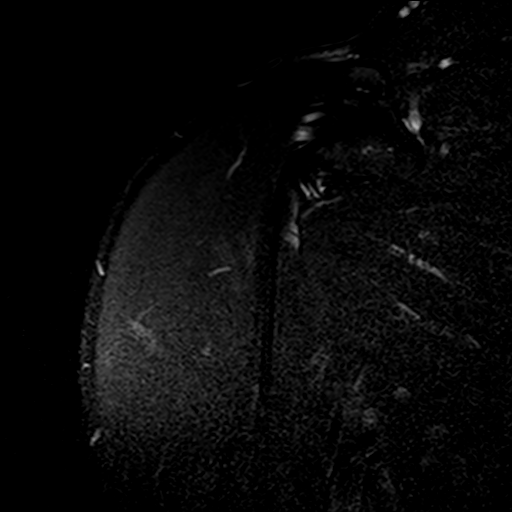
[im 12/21]
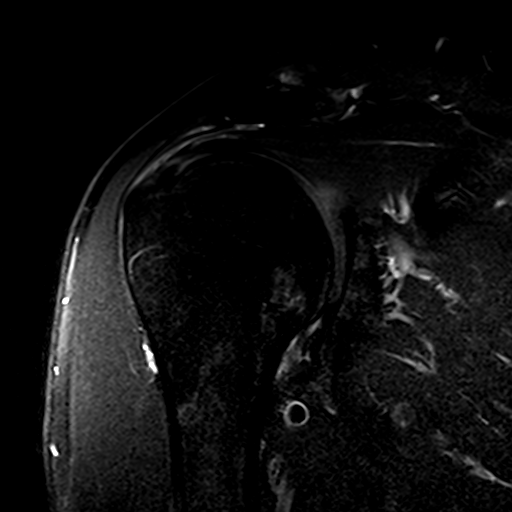
[im 18/21]
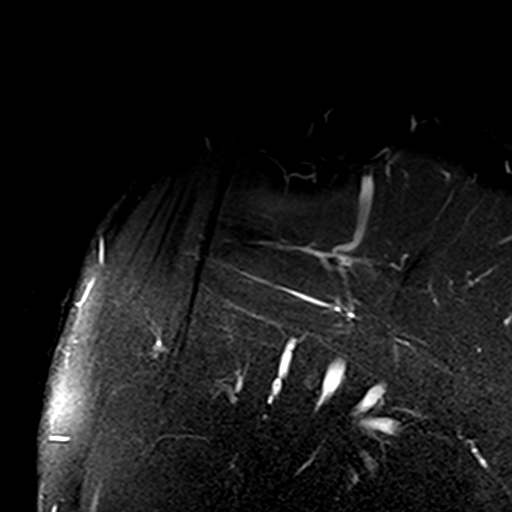

[Series 7: PD · oblique · 4.0mm · 0.22mm/px · 8 of 21 slices shown]
[im 1/21]
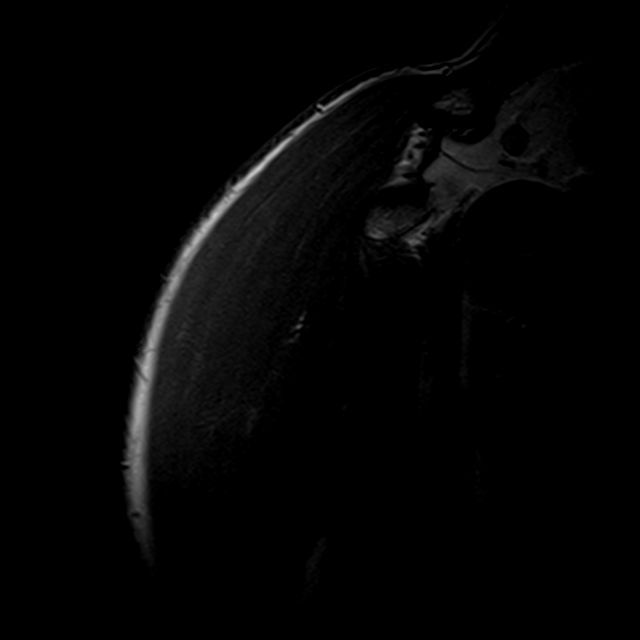
[im 3/21]
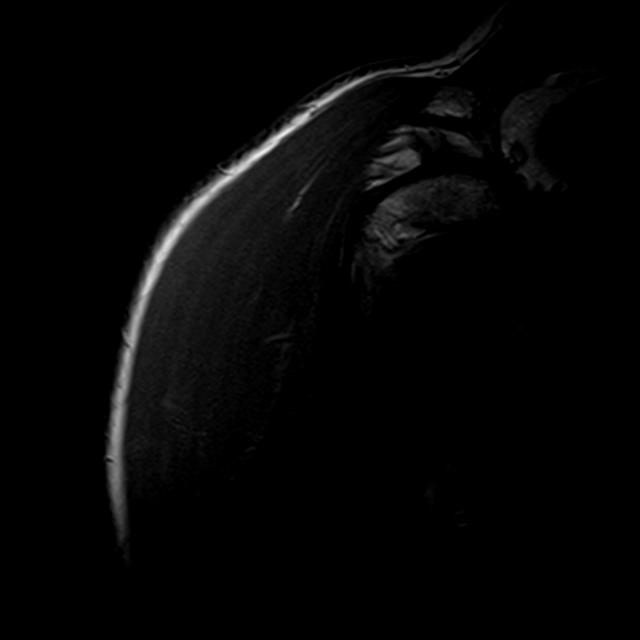
[im 6/21]
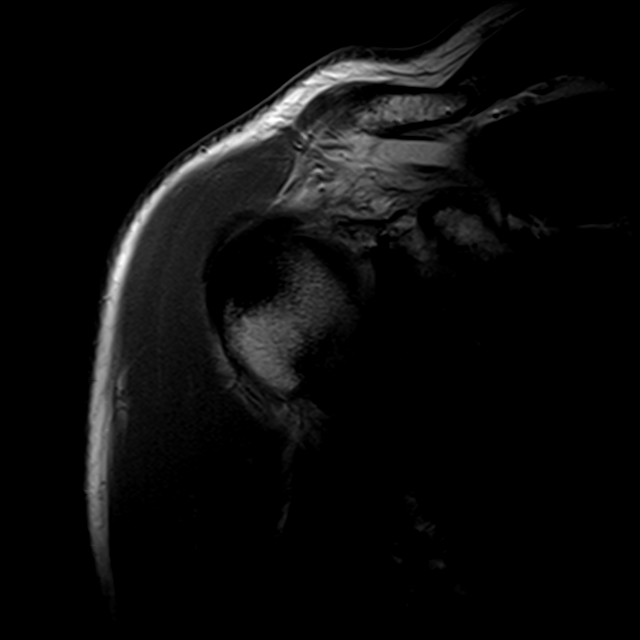
[im 9/21]
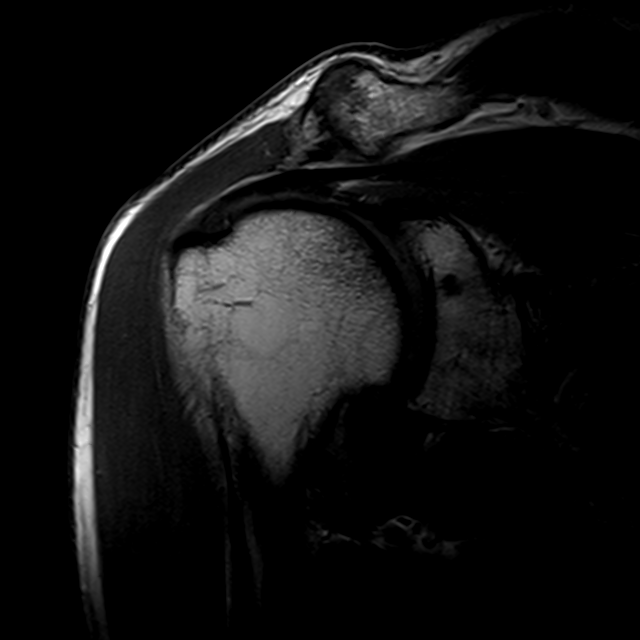
[im 12/21]
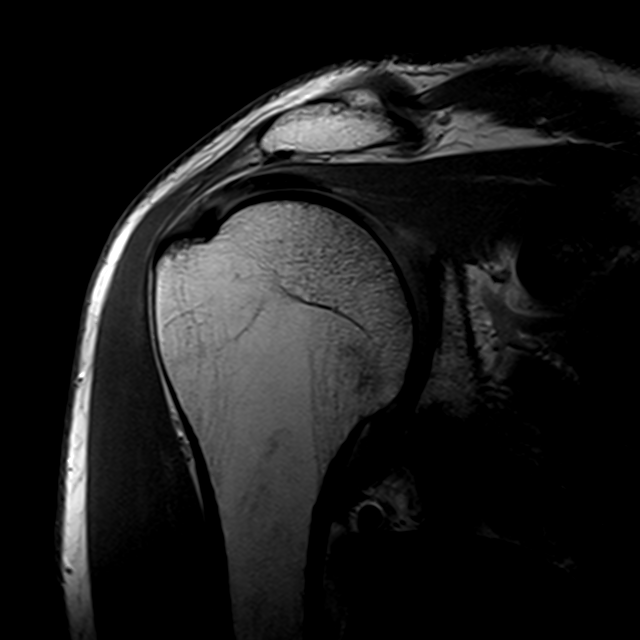
[im 15/21]
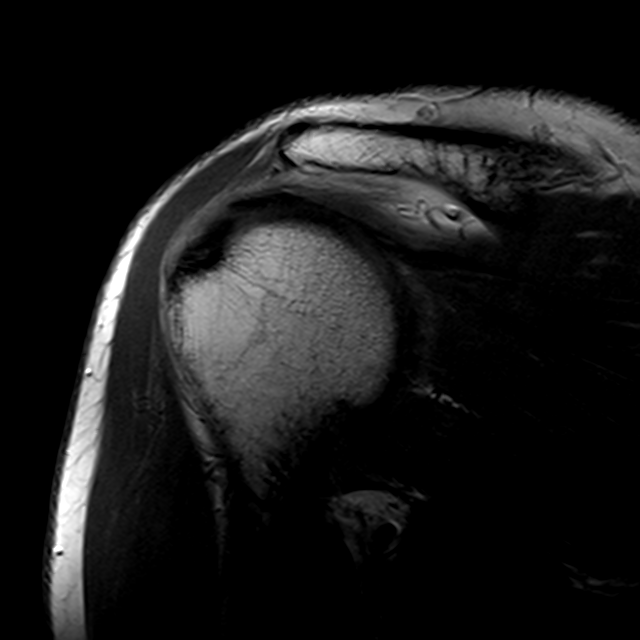
[im 18/21]
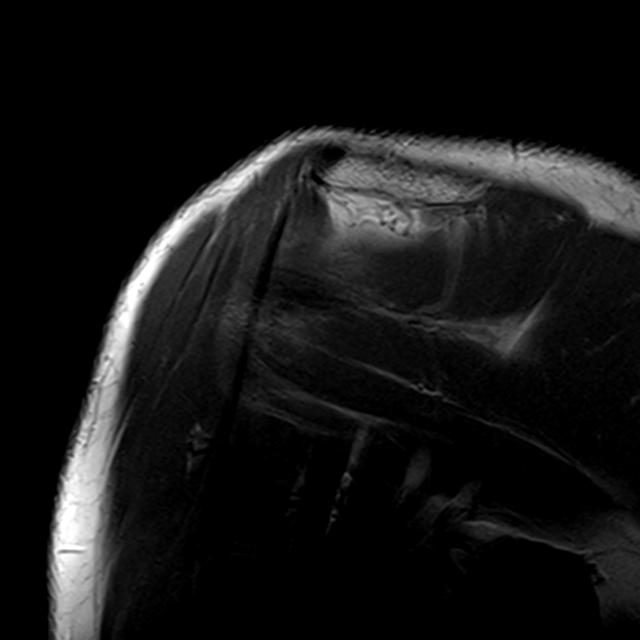
[im 21/21]
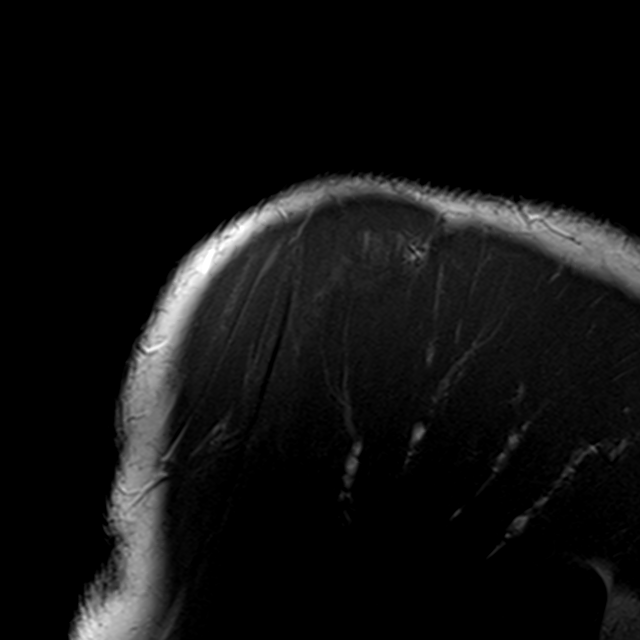

[25 of 40 positions shown; findings below may reference images not displayed]

FINDINGS: Rotator cuff: There is a lobulated cyst along the bursal surface of
the distal rotator cuff, near the confluence of the supraspinatus
and infraspinatus tendons. This measures 12 mm on sagittal image 19.
There may be mild adjacent bursal surface fraying of the rotator
cuff, but there is no evidence of rotator cuff tear. The
subscapularis and teres minor tendons appear normal.

Muscles:  No focal muscular atrophy or edema.

Biceps long head:  Intact and normally positioned.

Acromioclavicular Joint: The acromion is type 2. Moderate
acromioclavicular degenerative changes with inferiorly projecting
osteophytes. There is minimal free fluid in the
subacromial-subdeltoid bursa. As above, there is a probable ganglion
along the bursal surface of the distal rotator cuff.

Glenohumeral Joint: No significant shoulder joint effusion or
glenohumeral arthropathy. The joint capsule appears indistinct in
the axillary recess, a finding which is suggestive, although not
definitive for adhesive capsulitis.

Labrum: Labral evaluation limited by the lack of joint fluid. No
evidence of labral tear or paralabral cyst.

Bones: No acute or significant extra-articular osseous findings.

Other: No significant soft tissue findings.
IMPRESSION: 1. No evidence of rotator cuff tear. Possible mild bursal surface
fraying along the distal supraspinatus and infraspinatus tendons
adjacent to a lobulated ganglion in the subacromial-subdeltoid
bursa.
2. The labrum and biceps tendon appear intact.
3. Moderate acromioclavicular degenerative changes.
4. Possible adhesive capsulitis.

## 2020-02-27 ENCOUNTER — Other Ambulatory Visit: Payer: Self-pay | Admitting: Internal Medicine

## 2020-04-10 ENCOUNTER — Encounter: Payer: Self-pay | Admitting: Internal Medicine

## 2020-04-10 DIAGNOSIS — Z833 Family history of diabetes mellitus: Secondary | ICD-10-CM | POA: Insufficient documentation

## 2020-04-10 NOTE — Patient Instructions (Addendum)
Blood work was ordered.    All other Health Maintenance issues reviewed.   All recommended immunizations and age-appropriate screenings are up-to-date or discussed.  No immunization administered today.   Medications reviewed and updated.  Changes include :   none    Please followup in 1 year    Health Maintenance, Male Adopting a healthy lifestyle and getting preventive care are important in promoting health and wellness. Ask your health care provider about:  The right schedule for you to have regular tests and exams.  Things you can do on your own to prevent diseases and keep yourself healthy. What should I know about diet, weight, and exercise? Eat a healthy diet   Eat a diet that includes plenty of vegetables, fruits, low-fat dairy products, and lean protein.  Do not eat a lot of foods that are high in solid fats, added sugars, or sodium. Maintain a healthy weight Body mass index (BMI) is a measurement that can be used to identify possible weight problems. It estimates body fat based on height and weight. Your health care provider can help determine your BMI and help you achieve or maintain a healthy weight. Get regular exercise Get regular exercise. This is one of the most important things you can do for your health. Most adults should:  Exercise for at least 150 minutes each week. The exercise should increase your heart rate and make you sweat (moderate-intensity exercise).  Do strengthening exercises at least twice a week. This is in addition to the moderate-intensity exercise.  Spend less time sitting. Even light physical activity can be beneficial. Watch cholesterol and blood lipids Have your blood tested for lipids and cholesterol at 62 years of age, then have this test every 5 years. You may need to have your cholesterol levels checked more often if:  Your lipid or cholesterol levels are high.  You are older than 62 years of age.  You are at high risk for  heart disease. What should I know about cancer screening? Many types of cancers can be detected early and may often be prevented. Depending on your health history and family history, you may need to have cancer screening at various ages. This may include screening for:  Colorectal cancer.  Prostate cancer.  Skin cancer.  Lung cancer. What should I know about heart disease, diabetes, and high blood pressure? Blood pressure and heart disease  High blood pressure causes heart disease and increases the risk of stroke. This is more likely to develop in people who have high blood pressure readings, are of African descent, or are overweight.  Talk with your health care provider about your target blood pressure readings.  Have your blood pressure checked: ? Every 3-5 years if you are 31-96 years of age. ? Every year if you are 63 years old or older.  If you are between the ages of 42 and 59 and are a current or former smoker, ask your health care provider if you should have a one-time screening for abdominal aortic aneurysm (AAA). Diabetes Have regular diabetes screenings. This checks your fasting blood sugar level. Have the screening done:  Once every three years after age 74 if you are at a normal weight and have a low risk for diabetes.  More often and at a younger age if you are overweight or have a high risk for diabetes. What should I know about preventing infection? Hepatitis B If you have a higher risk for hepatitis B, you should be screened for  this virus. Talk with your health care provider to find out if you are at risk for hepatitis B infection. Hepatitis C Blood testing is recommended for:  Everyone born from 1945 through 1965.  Anyone with known risk factors for hepatitis C. Sexually transmitted infections (STIs)  You should be screened each year for STIs, including gonorrhea and chlamydia, if: ? You are sexually active and are younger than 62 years of age. ? You are  older than 62 years of age and your health care provider tells you that you are at risk for this type of infection. ? Your sexual activity has changed since you were last screened, and you are at increased risk for chlamydia or gonorrhea. Ask your health care provider if you are at risk.  Ask your health care provider about whether you are at high risk for HIV. Your health care provider may recommend a prescription medicine to help prevent HIV infection. If you choose to take medicine to prevent HIV, you should first get tested for HIV. You should then be tested every 3 months for as long as you are taking the medicine. Follow these instructions at home: Lifestyle  Do not use any products that contain nicotine or tobacco, such as cigarettes, e-cigarettes, and chewing tobacco. If you need help quitting, ask your health care provider.  Do not use street drugs.  Do not share needles.  Ask your health care provider for help if you need support or information about quitting drugs. Alcohol use  Do not drink alcohol if your health care provider tells you not to drink.  If you drink alcohol: ? Limit how much you have to 0-2 drinks a day. ? Be aware of how much alcohol is in your drink. In the [place] one drink equals one 12 oz bottle of beer (355 mL), one 5 oz glass of wine (148 mL), or one 1 oz glass of hard liquor (44 mL). General instructions  Schedule regular health, dental, and eye exams.  Stay current with your vaccines.  Tell your health care provider if: ? You often feel depressed. ? You have ever been abused or do not feel safe at home. Summary  Adopting a healthy lifestyle and getting preventive care are important in promoting health and wellness.  Follow your health care provider's instructions about healthy diet, exercising, and getting tested or screened for diseases.  Follow your health care provider's instructions on monitoring your cholesterol and blood pressure. This  information is not intended to replace advice given to you by your health care provider. Make sure you discuss any questions you have with your health care provider. Document Revised: 05/19/2018 Document Reviewed: 05/19/2018 Elsevier Patient Education  2020 Elsevier Inc.  

## 2020-04-10 NOTE — Progress Notes (Signed)
Subjective:    Patient ID: Timothy [name], male    DOB: 1955-08-13, 64 y.o.   MRN: [id number]  HPI He is here for a physical exam.   He has been doing the intermittent fasting.  He has had improvement in his GI system.   Medications and allergies reviewed with patient and updated if appropriate.  Patient Active Problem List   Diagnosis Date Noted  . Family history of diabetes mellitus in daughter 04/10/2020  . Umbilical hernia without obstruction or gangrene 01/04/2018  . S/P arthroscopy of left shoulder 07/21/2017  . Chronic pain of both shoulders 05/14/2017  . Sciatica of left side 10/11/2015  . History of basal cell carcinoma (BCC) 09/08/2014  . ED (erectile dysfunction) 05/28/2012  . Family history of ischemic heart disease 02/20/2012  . ELEVATED PROSTATE SPECIFIC ANTIGEN 10/11/2008  . Hypothyroidism 08/30/2007  . BPH (benign prostatic hyperplasia) 08/30/2007    Current Outpatient Medications on File Prior to Visit  Medication Sig Dispense Refill  . cetirizine (ZYRTEC) 10 MG tablet Take 10 mg by mouth daily.    Marland Kitchen levothyroxine (SYNTHROID) 150 MCG tablet TAKE 1 AND 1/2 TABLETS EVERY DAY EXCEPT TAKE 1 TABLET ON TUESDAY AND THURSDAY 114 tablet 0  . tadalafil (CIALIS) 20 MG tablet Take 0.5-1 tablets (10-20 mg total) by mouth every other day as needed for erectile dysfunction. 10 tablet 11  . triamcinolone (NASACORT AQ) 55 MCG/ACT nasal inhaler 2 sprays by Nasal route daily.      Marland Kitchen triamcinolone ointment (KENALOG) 0.1 % Apply topically.     No current facility-administered medications on file prior to visit.    Past Medical History:  Diagnosis Date  . Allergy    seasonal  . ED (erectile dysfunction)   . Gilbert syndrome   . Hypothyroidism     Past Surgical History:  Procedure Laterality Date  . APPENDECTOMY    . BASAL CELL CARCINOMA EXCISION  08/2014  . Benign Nevi      Removed x 3/ Dr. Crista Luria   . CARPAL TUNNEL RELEASE  5/12/20007   Bilateral ; Dr Fredna Dow  .  COLONOSCOPY  2005 & 2011   Normal; [facility]  . TONSILLECTOMY      Social History   Socioeconomic History  . Marital status: Married    Spouse name: Not on file  . Number of children: Not on file  . Years of education: Not on file  . Highest education level: Not on file  Occupational History  . Not on file  Tobacco Use  . Smoking status: Never Smoker  . Smokeless tobacco: Never Used  Substance and Sexual Activity  . Alcohol use: Yes    Alcohol/week: 0.0 standard drinks    Comment:  15 servings of beer or wine  . Drug use: No  . Sexual activity: Not on file  Other Topics Concern  . Not on file  Social History Narrative   Exercise - regular 5-7 times, cardio, weights   Social Determinants of Health   Financial Resource Strain:   . Difficulty of Paying Living Expenses: Not on file  Food Insecurity:   . Worried About Charity fundraiser in the Last Year: Not on file  . Ran Out of Food in the Last Year: Not on file  Transportation Needs:   . Lack of Transportation (Medical): Not on file  . Lack of Transportation (Non-Medical): Not on file  Physical Activity:   . Days of Exercise per Week: Not on  file  . Minutes of Exercise per Session: Not on file  Stress:   . Feeling of Stress : Not on file  Social Connections:   . Frequency of Communication with Friends and Family: Not on file  . Frequency of Social Gatherings with Friends and Family: Not on file  . Attends Religious Services: Not on file  . Active Member of Clubs or Organizations: Not on file  . Attends Archivist Meetings: Not on file  . Marital Status: Not on file    Family History  Problem Relation Age of Onset  . Hypothyroidism Father   . Colon polyps Father   . Heart attack Father 51       smoker  . Melanoma Father   . Lung cancer Father   . Hypothyroidism Mother   . Heart attack Mother 30       smoker  . Colonic polyp Mother   . Hypothyroidism Brother         X 3  . Melanoma Brother     . Diabetes Daughter        Type 1  . Stroke Neg Hx   . Colon cancer Neg Hx     Review of Systems  Constitutional: Negative for chills, fatigue and fever.  Eyes: Negative for visual disturbance.  Respiratory: Negative for cough, shortness of breath and wheezing.   Cardiovascular: Negative for chest pain, palpitations and leg swelling.  Gastrointestinal: Negative for abdominal pain, blood in stool, constipation, diarrhea and nausea.       No GERD  Genitourinary: Negative for difficulty urinating, dysuria and hematuria.  Musculoskeletal: Positive for arthralgias (mild). Negative for back pain.  Skin: Negative for rash.  Neurological: Negative for dizziness, light-headedness, numbness and headaches.  Psychiatric/Behavioral: Negative for dysphoric mood. The patient is not nervous/anxious.        Objective:   Vitals:   04/11/20 0751  BP: 124/72  Pulse: (!) 42  Temp: 98 F (36.7 C)  SpO2: 96%   Filed Weights   04/11/20 0751  Weight: 194 lb (88 kg)   Body mass index is 25.6 kg/m.  BP Readings from Last 3 Encounters:  04/11/20 124/72  01/19/19 126/80  01/04/18 128/86    Wt Readings from Last 3 Encounters:  04/11/20 194 lb (88 kg)  01/19/19 191 lb (86.6 kg)  01/04/18 201 lb (91.2 kg)     Physical Exam Constitutional: He appears well-developed and well-nourished. No distress.  HENT:  Head: Normocephalic and atraumatic.  Right Ear: External ear normal.  Left Ear: External ear normal.  Mouth/Throat: Oropharynx is clear and moist.  Normal ear canals and TM b/l  Eyes: Conjunctivae and EOM are normal.  Neck: Neck supple. No tracheal deviation present. No thyromegaly present.  No carotid bruit  Cardiovascular: Normal rate, regular rhythm, normal heart sounds and intact distal pulses.   No murmur heard. Pulmonary/Chest: Effort normal and breath sounds normal. No respiratory distress. He has no wheezes. He has no rales.  Abdominal: Soft. Umbilical hernia - reducible. He  exhibits no distension. There is no tenderness.  Genitourinary: deferred  Musculoskeletal: He exhibits no edema.  Lymphadenopathy:   He has no cervical adenopathy.  Skin: Skin is warm and dry. He is not diaphoretic.  Psychiatric: He has a normal mood and affect. His behavior is normal.         Assessment & Plan:   Physical exam: Screening blood work  ordered Immunizations  Flu vac done already.   Colonoscopy  Up to date     Eye exams   Due - will schedule  Exercise   Exercise 4-6 /week - weights, cardio Weight  normal Substance abuse   none   See Problem List for Assessment and Plan of chronic medical problems.   This visit occurred during the SARS-CoV-2 public health emergency.  Safety protocols were in place, including screening questions prior to the visit, additional usage of staff PPE, and extensive cleaning of exam room while observing appropriate contact time as indicated for disinfecting solutions.

## 2020-04-11 ENCOUNTER — Other Ambulatory Visit: Payer: Self-pay

## 2020-04-11 ENCOUNTER — Ambulatory Visit (INDEPENDENT_AMBULATORY_CARE_PROVIDER_SITE_OTHER): Payer: PRIVATE HEALTH INSURANCE | Admitting: Internal Medicine

## 2020-04-11 VITALS — BP 124/72 | HR 42 | Temp 98.0°F | Ht 73.0 in | Wt 194.0 lb

## 2020-04-11 DIAGNOSIS — Z833 Family history of diabetes mellitus: Secondary | ICD-10-CM | POA: Diagnosis not present

## 2020-04-11 DIAGNOSIS — Z85828 Personal history of other malignant neoplasm of skin: Secondary | ICD-10-CM

## 2020-04-11 DIAGNOSIS — Z0000 Encounter for general adult medical examination without abnormal findings: Secondary | ICD-10-CM

## 2020-04-11 DIAGNOSIS — N529 Male erectile dysfunction, unspecified: Secondary | ICD-10-CM

## 2020-04-11 DIAGNOSIS — K429 Umbilical hernia without obstruction or gangrene: Secondary | ICD-10-CM | POA: Diagnosis not present

## 2020-04-11 DIAGNOSIS — Z0001 Encounter for general adult medical examination with abnormal findings: Secondary | ICD-10-CM | POA: Diagnosis not present

## 2020-04-11 DIAGNOSIS — E038 Other specified hypothyroidism: Secondary | ICD-10-CM

## 2020-04-11 LAB — COMPREHENSIVE METABOLIC PANEL
ALT: 22 U/L (ref 0–53)
AST: 25 U/L (ref 0–37)
Albumin: 4.2 g/dL (ref 3.5–5.2)
Alkaline Phosphatase: 36 U/L — ABNORMAL LOW (ref 39–117)
BUN: 14 mg/dL (ref 6–23)
CO2: 27 mEq/L (ref 19–32)
Calcium: 9.1 mg/dL (ref 8.4–10.5)
Chloride: 105 mEq/L (ref 96–112)
Creatinine, Ser: 1.19 mg/dL (ref 0.40–1.50)
GFR: 65.42 mL/min (ref >60.00)
Glucose, Bld: 86 mg/dL (ref 70–99)
Potassium: 4 mEq/L (ref 3.5–5.1)
Sodium: 139 mEq/L (ref 135–145)
Total Bilirubin: 0.6 mg/dL (ref 0.2–1.2)
Total Protein: 6.8 g/dL (ref 6.0–8.3)

## 2020-04-11 LAB — LIPID PANEL
Cholesterol: 165 mg/dL (ref 0–200)
HDL: 86.6 mg/dL (ref >39.00)
LDL Cholesterol: 66 mg/dL (ref 0–99)
NonHDL: 78.71
Total CHOL/HDL Ratio: 2
Triglycerides: 62 mg/dL (ref 0.0–149.0)
VLDL: 12.4 mg/dL (ref 0.0–40.0)

## 2020-04-11 LAB — CBC WITH DIFFERENTIAL/PLATELET
Basophils Absolute: 0 10*3/uL (ref 0.0–0.1)
Basophils Relative: 0.5 % (ref 0.0–3.0)
Eosinophils Absolute: 0.1 10*3/uL (ref 0.0–0.7)
Eosinophils Relative: 3.1 % (ref 0.0–5.0)
HCT: 41.9 % (ref 39.0–52.0)
Hemoglobin: 14 g/dL (ref 13.0–17.0)
Lymphocytes Relative: 44.1 % (ref 12.0–46.0)
Lymphs Abs: 1.9 10*3/uL (ref 0.7–4.0)
MCHC: 33.3 g/dL (ref 30.0–36.0)
MCV: 95.6 fl (ref 78.0–100.0)
Monocytes Absolute: 0.5 10*3/uL (ref 0.1–1.0)
Monocytes Relative: 12.3 % — ABNORMAL HIGH (ref 3.0–12.0)
Neutro Abs: 1.7 10*3/uL (ref 1.4–7.7)
Neutrophils Relative %: 40 % — ABNORMAL LOW (ref 43.0–77.0)
Platelets: 189 10*3/uL (ref 150.0–400.0)
RBC: 4.38 Mil/uL (ref 4.22–5.81)
RDW: 14 % (ref 11.5–15.5)
WBC: 4.3 10*3/uL (ref 4.0–10.5)

## 2020-04-11 LAB — TSH: TSH: 0.73 u[IU]/mL (ref 0.35–4.50)

## 2020-04-11 LAB — HEMOGLOBIN A1C: Hgb A1c MFr Bld: 5.7 % (ref 4.6–6.5)

## 2020-04-11 NOTE — Assessment & Plan Note (Signed)
Chronic occ pain, not obstructed  Discussed surgery - will just monitor for now

## 2020-04-11 NOTE — Assessment & Plan Note (Signed)
Sees Dr Renda Rolls yearly

## 2020-04-11 NOTE — Assessment & Plan Note (Signed)
Chronic continue cialis 10-20 mg prn

## 2020-04-11 NOTE — Assessment & Plan Note (Signed)
Sugars in high normal range Will check a1c given family history

## 2020-04-11 NOTE — Assessment & Plan Note (Signed)
Chronic  Clinically euthyroid Currently taking levothyroxine175 mg 5 / week, 150 mcg 2/week Check tsh  Titrate med dose if needed

## 2020-04-12 LAB — PSA, TOTAL AND FREE
PSA, % Free: 20 % (calc) — ABNORMAL LOW (ref >25)
PSA, Free: 0.1 ng/mL
PSA, Total: 0.5 ng/mL (ref <=4.0)

## 2020-04-13 ENCOUNTER — Encounter: Payer: Self-pay | Admitting: Internal Medicine

## 2020-05-20 ENCOUNTER — Other Ambulatory Visit: Payer: Self-pay | Admitting: Internal Medicine

## 2020-08-05 ENCOUNTER — Other Ambulatory Visit: Payer: Self-pay | Admitting: Internal Medicine

## 2020-10-31 ENCOUNTER — Other Ambulatory Visit: Payer: Self-pay | Admitting: Internal Medicine

## 2020-11-26 ENCOUNTER — Telehealth: Payer: Self-pay | Admitting: Internal Medicine

## 2020-11-26 NOTE — Telephone Encounter (Signed)
   Patient calling to request medication for poison ivy on arms and legs/ Patient states she has been using calamine lotion, but no relief  Please advise

## 2020-11-26 NOTE — Telephone Encounter (Signed)
Appointment made and patient notified

## 2020-11-27 ENCOUNTER — Ambulatory Visit (INDEPENDENT_AMBULATORY_CARE_PROVIDER_SITE_OTHER): Payer: PRIVATE HEALTH INSURANCE | Admitting: Internal Medicine

## 2020-11-27 ENCOUNTER — Encounter: Payer: Self-pay | Admitting: Internal Medicine

## 2020-11-27 ENCOUNTER — Other Ambulatory Visit: Payer: Self-pay

## 2020-11-27 VITALS — BP 132/76 | HR 42 | Temp 97.7°F | Ht 72.0 in | Wt 193.0 lb

## 2020-11-27 DIAGNOSIS — L237 Allergic contact dermatitis due to plants, except food: Secondary | ICD-10-CM | POA: Diagnosis not present

## 2020-11-27 MED ORDER — PREDNISONE 10 MG PO TABS: ORAL_TABLET | ORAL | 0 refills | Status: DC

## 2020-11-27 NOTE — Progress Notes (Signed)
Subjective:    Patient ID: Timothy [name], male    DOB: [birth year], 65 y.o.   MRN: [id number]  HPI The patient is here for an acute visit.  Friday, he cut down a tree that had poison ivy all over it and did not realize it - rash started the next day and much worse two days later.  On arms, legs and chest.  He has been using a topical poison ivy lotion and that has helped.  Not getting worse now, but diffuse and very itchy.    Medications and allergies reviewed with patient and updated if appropriate.  Patient Active Problem List   Diagnosis Date Noted   Family history of diabetes mellitus in daughter 77/[id number]/[id number]   Umbilical hernia without obstruction or gangrene 01/04/2018   S/P arthroscopy of left shoulder 07/21/2017   Chronic pain of both shoulders 05/14/2017   Sciatica of left side 10/11/2015   History of basal cell carcinoma (BCC) 09/08/2014   ED (erectile dysfunction) 05/28/2012   Family history of ischemic heart disease 02/20/2012   ELEVATED PROSTATE SPECIFIC ANTIGEN 10/11/2008   Hypothyroidism 08/30/2007   BPH (benign prostatic hyperplasia) 08/30/2007    Current Outpatient Medications on File Prior to Visit  Medication Sig Dispense Refill   cetirizine (ZYRTEC) 10 MG tablet Take 10 mg by mouth daily.     levothyroxine (SYNTHROID) 150 MCG tablet TAKE 1 AND 1/2 TABLETS EVERY DAY EXCEPT TAKE 1 TABLET ON TUESDAY AND THURSDAY **NEED OV** 114 tablet 0   tadalafil (CIALIS) 20 MG tablet Take 0.5-1 tablets (10-20 mg total) by mouth every other day as needed for erectile dysfunction. 10 tablet 11   triamcinolone (NASACORT) 55 MCG/ACT nasal inhaler 2 sprays by Nasal route daily.       triamcinolone ointment (KENALOG) 0.1 % Apply topically.     No current facility-administered medications on file prior to visit.    Past Medical History:  Diagnosis Date   Allergy    seasonal   ED (erectile dysfunction)    Gilbert syndrome    Hypothyroidism     Past Surgical History:   Procedure Laterality Date   APPENDECTOMY     BASAL CELL CARCINOMA EXCISION  08/2014   Benign Nevi      Removed x 3/ Dr. Crista Luria    CARPAL TUNNEL RELEASE  5/12/20007   Bilateral ; Dr Fredna Dow   COLONOSCOPY  2005 & 2011   Normal; [facility]   TONSILLECTOMY      Social History   Socioeconomic History   Marital status: Married    Spouse name: Not on file   Number of children: Not on file   Years of education: Not on file   Highest education level: Not on file  Occupational History   Not on file  Tobacco Use   Smoking status: Never   Smokeless tobacco: Never  Substance and Sexual Activity   Alcohol use: Yes    Alcohol/week: 0.0 standard drinks    Comment:  15 servings of beer or wine   Drug use: No   Sexual activity: Not on file  Other Topics Concern   Not on file  Social History Narrative   Exercise - regular 5-7 times, cardio, weights   Social Determinants of Health   Financial Resource Strain: Not on file  Food Insecurity: Not on file  Transportation Needs: Not on file  Physical Activity: Not on file  Stress: Not on file  Social Connections: Not on file  Family History  Problem Relation Age of Onset   Hypothyroidism Father    Colon polyps Father    Heart attack Father 7       smoker   Melanoma Father    Lung cancer Father    Hypothyroidism Mother    Heart attack Mother 69       smoker   Colonic polyp Mother    Hypothyroidism Brother         X 3   Melanoma Brother    Diabetes Daughter        Type 1   Stroke Neg Hx    Colon cancer Neg Hx     Review of Systems     Objective:   Vitals:   11/27/20 1601  BP: 132/76  Pulse: (!) 42  Temp: 97.7 F (36.5 C)  SpO2: 97%   BP Readings from Last 3 Encounters:  11/27/20 132/76  04/11/20 124/72  01/19/19 126/80   Wt Readings from Last 3 Encounters:  11/27/20 193 lb (87.5 kg)  04/11/20 194 lb (88 kg)  01/19/19 191 lb (86.6 kg)   Body mass index is 26.18 kg/m.   Physical  Exam Constitutional:      General: He is not in acute distress.    Appearance: Normal appearance. He is not ill-appearing.  Skin:    General: Skin is warm and dry.     Findings: Rash (macular papular rash on arms - with scratches from branches. no open wounds or blisters.  did not examine legs or chest) present.  Neurological:     Mental Status: He is alert.           Assessment & Plan:    See Problem List for Assessment and Plan of chronic medical problems.    This visit occurred during the SARS-CoV-2 public health emergency.  Safety protocols were in place, including screening questions prior to the visit, additional usage of staff PPE, and extensive cleaning of exam room while observing appropriate contact time as indicated for disinfecting solutions.

## 2020-11-27 NOTE — Patient Instructions (Signed)
       Medications changes include :   prednisone taper  Your prescription(s) have been submitted to your pharmacy. Please take as directed and contact our office if you believe you are having problem(s) with the medication(s).

## 2020-11-27 NOTE — Assessment & Plan Note (Signed)
Acute Rash typical of poison ivy dermatitis Continue daily oral anti-histamine Continue topical poison ivy ointment which is helping Start prednisone taper - 30 mg x 3 days, 20 mg x 3 days and 10 mg x 3 days  Call if no improvement

## 2020-12-20 ENCOUNTER — Other Ambulatory Visit: Payer: Self-pay | Admitting: Chiropractic Medicine

## 2020-12-20 DIAGNOSIS — M25561 Pain in right knee: Secondary | ICD-10-CM

## 2020-12-24 ENCOUNTER — Other Ambulatory Visit: Payer: Self-pay

## 2020-12-24 ENCOUNTER — Ambulatory Visit
Admission: RE | Admit: 2020-12-24 | Discharge: 2020-12-24 | Disposition: A | Discharge status: Home / Self Care | Payer: Self-pay | Source: Ambulatory Visit | Attending: Chiropractic Medicine | Admitting: Chiropractic Medicine

## 2020-12-24 DIAGNOSIS — M25561 Pain in right knee: Secondary | ICD-10-CM

## 2020-12-26 ENCOUNTER — Other Ambulatory Visit: Payer: PRIVATE HEALTH INSURANCE

## 2021-01-21 ENCOUNTER — Other Ambulatory Visit: Payer: Self-pay | Admitting: Internal Medicine

## 2021-02-05 ENCOUNTER — Other Ambulatory Visit: Payer: Self-pay | Admitting: Chiropractic Medicine

## 2021-02-05 DIAGNOSIS — R52 Pain, unspecified: Secondary | ICD-10-CM

## 2021-02-12 NOTE — Patient Instructions (Addendum)
Blood work was ordered.    Flu immunization administered today.     Medications changes include :   none  Your prescription(s) have been submitted to your pharmacy. Please take as directed and contact our office if you believe you are having problem(s) with the medication(s).   A referral was ordered for GI-Dr. Fuller Plan and [facility] for your finger.      Someone from their office will call you to schedule an appointment.    Please followup in 1 year    Health Maintenance, Male Adopting a healthy lifestyle and getting preventive care are important in promoting health and wellness. Ask your health care provider about: The right schedule for you to have regular tests and exams. Things you can do on your own to prevent diseases and keep yourself healthy. What should I know about diet, weight, and exercise? Eat a healthy diet  Eat a diet that includes plenty of vegetables, fruits, low-fat dairy products, and lean protein. Do not eat a lot of foods that are high in solid fats, added sugars, or sodium. Maintain a healthy weight Body mass index (BMI) is a measurement that can be used to identify possible weight problems. It estimates body fat based on height and weight. Your health care provider can help determine your BMI and help you achieve or maintain a healthy weight. Get regular exercise Get regular exercise. This is one of the most important things you can do for your health. Most adults should: Exercise for at least 150 minutes each week. The exercise should increase your heart rate and make you sweat (moderate-intensity exercise). Do strengthening exercises at least twice a week. This is in addition to the moderate-intensity exercise. Spend less time sitting. Even light physical activity can be beneficial. Watch cholesterol and blood lipids Have your blood tested for lipids and cholesterol at 63 years of age, then have this test every 5 years. You may need to have your cholesterol  levels checked more often if: Your lipid or cholesterol levels are high. You are older than 63 years of age. You are at high risk for heart disease. What should I know about cancer screening? Many types of cancers can be detected early and may often be prevented. Depending on your health history and family history, you may need to have cancer screening at various ages. This may include screening for: Colorectal cancer. Prostate cancer. Skin cancer. Lung cancer. What should I know about heart disease, diabetes, and high blood pressure? Blood pressure and heart disease High blood pressure causes heart disease and increases the risk of stroke. This is more likely to develop in people who have high blood pressure readings, are of African descent, or are overweight. Talk with your health care provider about your target blood pressure readings. Have your blood pressure checked: Every 3-5 years if you are 38-66 years of age. Every year if you are 13 years old or older. If you are between the ages of 89 and 60 and are a current or former smoker, ask your health care provider if you should have a one-time screening for abdominal aortic aneurysm (AAA). Diabetes Have regular diabetes screenings. This checks your fasting blood sugar level. Have the screening done: Once every three years after age 64 if you are at a normal weight and have a low risk for diabetes. More often and at a younger age if you are overweight or have a high risk for diabetes. What should I know about preventing infection? Hepatitis B  If you have a higher risk for hepatitis B, you should be screened for this virus. Talk with your health care provider to find out if you are at risk for hepatitis B infection. Hepatitis C Blood testing is recommended for: Everyone born from [birth year] through 1965. Anyone with known risk factors for hepatitis C. Sexually transmitted infections (STIs) You should be screened each year for STIs, including  gonorrhea and chlamydia, if: You are sexually active and are younger than 63 years of age. You are older than 63 years of age and your health care provider tells you that you are at risk for this type of infection. Your sexual activity has changed since you were last screened, and you are at increased risk for chlamydia or gonorrhea. Ask your health care provider if you are at risk. Ask your health care provider about whether you are at high risk for HIV. Your health care provider may recommend a prescription medicine to help prevent HIV infection. If you choose to take medicine to prevent HIV, you should first get tested for HIV. You should then be tested every 3 months for as long as you are taking the medicine. Follow these instructions at home: Lifestyle Do not use any products that contain nicotine or tobacco, such as cigarettes, e-cigarettes, and chewing tobacco. If you need help quitting, ask your health care provider. Do not use street drugs. Do not share needles. Ask your health care provider for help if you need support or information about quitting drugs. Alcohol use Do not drink alcohol if your health care provider tells you not to drink. If you drink alcohol: Limit how much you have to 0-2 drinks a day. Be aware of how much alcohol is in your drink. In the [place] one drink equals one 12 oz bottle of beer (355 mL), one 5 oz glass of wine (148 mL), or one 1 oz glass of hard liquor (44 mL). General instructions Schedule regular health, dental, and eye exams. Stay current with your vaccines. Tell your health care provider if: You often feel depressed. You have ever been abused or do not feel safe at home. Summary Adopting a healthy lifestyle and getting preventive care are important in promoting health and wellness. Follow your health care provider's instructions about healthy diet, exercising, and getting tested or screened for diseases. Follow your health care provider's  instructions on monitoring your cholesterol and blood pressure. This information is not intended to replace advice given to you by your health care provider. Make sure you discuss any questions you have with your health care provider. Document Revised: 08/03/2020 Document Reviewed: 05/19/2018 Elsevier Patient Education  2022 [employer].

## 2021-02-12 NOTE — Progress Notes (Signed)
Subjective:    Patient ID: Timothy [name], male    DOB: 01/09/1956, 65 y.o.   MRN: [id number]   This visit occurred during the SARS-CoV-2 public health emergency.  Safety protocols were in place, including screening questions prior to the visit, additional usage of staff PPE, and extensive cleaning of exam room while observing appropriate contact time as indicated for disinfecting solutions.   HPI He is here for a physical exam.   Injured left index finger - PIP joint swelling and tenderness.  Not getting much better.    Medications and allergies reviewed with patient and updated if appropriate.  Patient Active Problem List   Diagnosis Date Noted   Hyperglycemia 02/13/2021   Poison ivy dermatitis 11/27/2020   Family history of diabetes mellitus in daughter [id number]   Umbilical hernia without obstruction or gangrene 01/04/2018   S/P arthroscopy of left shoulder 07/21/2017   Chronic pain of both shoulders 05/14/2017   Sciatica of left side 10/11/2015   History of basal cell carcinoma (BCC) 09/08/2014   ED (erectile dysfunction) 05/28/2012   Family history of ischemic heart disease 02/20/2012   ELEVATED PROSTATE SPECIFIC ANTIGEN 10/11/2008   Hypothyroidism 08/30/2007   BPH (benign prostatic hyperplasia) 08/30/2007    Current Outpatient Medications on File Prior to Visit  Medication Sig Dispense Refill   cetirizine (ZYRTEC) 10 MG tablet Take 10 mg by mouth daily.     levothyroxine (SYNTHROID) 150 MCG tablet TAKE 1 AND 1/2 TABLETS EVERY DAY EXCEPT TAKE 1 TABLET ON TUESDAY AND THURSDAY **NEED OV** 114 tablet 0   tadalafil (CIALIS) 20 MG tablet Take 0.5-1 tablets (10-20 mg total) by mouth every other day as needed for erectile dysfunction. 10 tablet 11   triamcinolone (NASACORT) 55 MCG/ACT nasal inhaler 2 sprays by Nasal route daily.       triamcinolone ointment (KENALOG) 0.1 % Apply topically.     ibuprofen (ADVIL) 600 MG tablet Take 600 mg by mouth every 6 (six) hours as  needed.     No current facility-administered medications on file prior to visit.    Past Medical History:  Diagnosis Date   Allergy    seasonal   ED (erectile dysfunction)    Gilbert syndrome    Hypothyroidism     Past Surgical History:  Procedure Laterality Date   APPENDECTOMY     BASAL CELL CARCINOMA EXCISION  08/2014   Benign Nevi      Removed x 3/ Dr. Crista Luria    CARPAL TUNNEL RELEASE  5/12/20007   Bilateral ; Dr Fredna Dow   COLONOSCOPY  2005 & 2011   Normal; [facility]   TONSILLECTOMY      Social History   Socioeconomic History   Marital status: Married    Spouse name: Not on file   Number of children: Not on file   Years of education: Not on file   Highest education level: Not on file  Occupational History   Not on file  Tobacco Use   Smoking status: Never   Smokeless tobacco: Never  Substance and Sexual Activity   Alcohol use: Yes    Alcohol/week: 0.0 standard drinks    Comment:  15 servings of beer or wine   Drug use: No   Sexual activity: Not on file  Other Topics Concern   Not on file  Social History Narrative   Exercise - regular 5-7 times, cardio, weights   Social Determinants of Health   Financial Resource Strain: Not on file  Food Insecurity: Not on file  Transportation Needs: Not on file  Physical Activity: Not on file  Stress: Not on file  Social Connections: Not on file    Family History  Problem Relation Age of Onset   Hypothyroidism Father    Colon polyps Father    Heart attack Father 64       smoker   Melanoma Father    Lung cancer Father    Hypothyroidism Mother    Heart attack Mother 29       smoker   Colonic polyp Mother    Hypothyroidism Brother         X 3   Melanoma Brother    Diabetes Daughter        Type 1   Stroke Neg Hx    Colon cancer Neg Hx     Review of Systems  Constitutional:  Negative for chills and fever.  Eyes:  Negative for visual disturbance.  Respiratory:  Negative for cough, shortness of  breath and wheezing.   Cardiovascular:  Negative for chest pain, palpitations and leg swelling.  Gastrointestinal:  Negative for abdominal pain, blood in stool, constipation, diarrhea and nausea.       No  gerd  Genitourinary:  Negative for difficulty urinating, dysuria and hematuria.  Musculoskeletal:  Positive for arthralgias (mild knees). Negative for back pain.  Skin:  Negative for rash.  Neurological:  Negative for dizziness, light-headedness and headaches.  Psychiatric/Behavioral:  Negative for dysphoric mood. The patient is not nervous/anxious.       Objective:   Vitals:   02/13/21 0825  BP: 128/72  Pulse: (!) 46  Temp: 98.4 F (36.9 C)  SpO2: 97%   Filed Weights   02/13/21 0825  Weight: 194 lb (88 kg)   Body mass index is 26.31 kg/m.  BP Readings from Last 3 Encounters:  02/13/21 128/72  11/27/20 132/76  04/11/20 124/72    Wt Readings from Last 3 Encounters:  02/13/21 194 lb (88 kg)  11/27/20 193 lb (87.5 kg)  04/11/20 194 lb (88 kg)     Physical Exam Constitutional: He appears well-developed and well-nourished. No distress.  HENT:  Head: Normocephalic and atraumatic.  Right Ear: External ear normal.  Left Ear: External ear normal.  Mouth/Throat: Oropharynx is clear and moist.  Normal ear canals and TM b/l  Eyes: Conjunctivae and EOM are normal.  Neck: Neck supple. No tracheal deviation present. No thyromegaly present.  No carotid bruit  Cardiovascular: Normal rate, regular rhythm, normal heart sounds and intact distal pulses.   No murmur heard. Pulmonary/Chest: Effort normal and breath sounds normal. No respiratory distress. He has no wheezes. He has no rales.  Abdominal: Soft. He exhibits no distension. There is no tenderness.  Genitourinary: deferred  Musculoskeletal: He exhibits no edema.  No deformity of left index PIP Lymphadenopathy:   He has no cervical adenopathy.  Skin: Skin is warm and dry. He is not diaphoretic.  Psychiatric: He has a  normal mood and affect. His behavior is normal.         Assessment & Plan:   Physical exam: Screening blood work  ordered Exercise   regular - elliptical  Weight  good for age Substance abuse   none   Reviewed recommended immunizations.   Health Maintenance  Topic Date Due   COVID-19 Vaccine (4 - Booster for Pfizer series) 08/27/2020   COLONOSCOPY (Pts 45-69yr Insurance coverage will need to be confirmed)  12/19/2020   INFLUENZA VACCINE  01/07/2021   TETANUS/TDAP  10/10/2025   Hepatitis C Screening  Completed   HIV Screening  Completed   Zoster Vaccines- Shingrix  Completed   HPV VACCINES  Aged Out   Pneumococcal Vaccine 15-72 Years old  Discontinued     See Problem List for Assessment and Plan of chronic medical problems.

## 2021-02-13 ENCOUNTER — Other Ambulatory Visit: Payer: Self-pay

## 2021-02-13 ENCOUNTER — Ambulatory Visit (INDEPENDENT_AMBULATORY_CARE_PROVIDER_SITE_OTHER): Payer: 59 | Admitting: Internal Medicine

## 2021-02-13 ENCOUNTER — Encounter: Payer: Self-pay | Admitting: Internal Medicine

## 2021-02-13 VITALS — BP 128/72 | HR 46 | Temp 98.4°F | Ht 72.0 in | Wt 194.0 lb

## 2021-02-13 DIAGNOSIS — Z0000 Encounter for general adult medical examination without abnormal findings: Secondary | ICD-10-CM

## 2021-02-13 DIAGNOSIS — Z85828 Personal history of other malignant neoplasm of skin: Secondary | ICD-10-CM

## 2021-02-13 DIAGNOSIS — Z125 Encounter for screening for malignant neoplasm of prostate: Secondary | ICD-10-CM

## 2021-02-13 DIAGNOSIS — R739 Hyperglycemia, unspecified: Secondary | ICD-10-CM | POA: Diagnosis not present

## 2021-02-13 DIAGNOSIS — E038 Other specified hypothyroidism: Secondary | ICD-10-CM | POA: Diagnosis not present

## 2021-02-13 DIAGNOSIS — M79645 Pain in left finger(s): Secondary | ICD-10-CM

## 2021-02-13 DIAGNOSIS — Z86010 Personal history of colonic polyps: Secondary | ICD-10-CM

## 2021-02-13 DIAGNOSIS — Z23 Encounter for immunization: Secondary | ICD-10-CM | POA: Diagnosis not present

## 2021-02-13 LAB — LIPID PANEL
Cholesterol: 170 mg/dL (ref 0–200)
HDL: 82.7 mg/dL (ref >39.00)
LDL Cholesterol: 75 mg/dL (ref 0–99)
NonHDL: 86.84
Total CHOL/HDL Ratio: 2
Triglycerides: 60 mg/dL (ref 0.0–149.0)
VLDL: 12 mg/dL (ref 0.0–40.0)

## 2021-02-13 LAB — CBC WITH DIFFERENTIAL/PLATELET
Basophils Absolute: 0 10*3/uL (ref 0.0–0.1)
Basophils Relative: 0.3 % (ref 0.0–3.0)
Eosinophils Absolute: 0.2 10*3/uL (ref 0.0–0.7)
Eosinophils Relative: 3 % (ref 0.0–5.0)
HCT: 42.1 % (ref 39.0–52.0)
Hemoglobin: 13.9 g/dL (ref 13.0–17.0)
Lymphocytes Relative: 31.9 % (ref 12.0–46.0)
Lymphs Abs: 1.8 10*3/uL (ref 0.7–4.0)
MCHC: 33.1 g/dL (ref 30.0–36.0)
MCV: 95.1 fl (ref 78.0–100.0)
Monocytes Absolute: 0.6 10*3/uL (ref 0.1–1.0)
Monocytes Relative: 11.6 % (ref 3.0–12.0)
Neutro Abs: 3 10*3/uL (ref 1.4–7.7)
Neutrophils Relative %: 53.2 % (ref 43.0–77.0)
Platelets: 196 10*3/uL (ref 150.0–400.0)
RBC: 4.43 Mil/uL (ref 4.22–5.81)
RDW: 13.9 % (ref 11.5–15.5)
WBC: 5.6 10*3/uL (ref 4.0–10.5)

## 2021-02-13 LAB — COMPREHENSIVE METABOLIC PANEL
ALT: 22 U/L (ref 0–53)
AST: 26 U/L (ref 0–37)
Albumin: 4.1 g/dL (ref 3.5–5.2)
Alkaline Phosphatase: 38 U/L — ABNORMAL LOW (ref 39–117)
BUN: 17 mg/dL (ref 6–23)
CO2: 26 mEq/L (ref 19–32)
Calcium: 9.3 mg/dL (ref 8.4–10.5)
Chloride: 105 mEq/L (ref 96–112)
Creatinine, Ser: 1.01 mg/dL (ref 0.40–1.50)
GFR: 79.18 mL/min (ref >60.00)
Glucose, Bld: 87 mg/dL (ref 70–99)
Potassium: 3.8 mEq/L (ref 3.5–5.1)
Sodium: 141 mEq/L (ref 135–145)
Total Bilirubin: 0.8 mg/dL (ref 0.2–1.2)
Total Protein: 6.9 g/dL (ref 6.0–8.3)

## 2021-02-13 LAB — TSH: TSH: 0.06 u[IU]/mL — ABNORMAL LOW (ref 0.35–5.50)

## 2021-02-13 LAB — HEMOGLOBIN A1C: Hgb A1c MFr Bld: 5.7 % (ref 4.6–6.5)

## 2021-02-13 NOTE — Assessment & Plan Note (Signed)
Chronic  Clinically euthyroid Currently taking levothyroxine 150 mcg 5 days a week to 25 mcg rest of the week  check tsh  Titrate med dose if needed

## 2021-02-13 NOTE — Assessment & Plan Note (Signed)
Chronic Has diabetes Check A1c Low sugar/carbohydrate diet regular exercise recommended

## 2021-02-13 NOTE — Addendum Note (Signed)
Addended by: Boris Lown B on: 02/13/2021 09:06 AM   Modules accepted: Orders

## 2021-02-13 NOTE — Addendum Note (Signed)
Addended by: Marcina Millard on: 02/13/2021 09:17 AM   Modules accepted: Orders

## 2021-02-13 NOTE — Assessment & Plan Note (Signed)
Sees dermatology yearly

## 2021-02-14 ENCOUNTER — Other Ambulatory Visit: Payer: Self-pay

## 2021-02-14 ENCOUNTER — Other Ambulatory Visit: Payer: Self-pay | Admitting: Internal Medicine

## 2021-02-14 LAB — PSA, TOTAL AND FREE
PSA, % Free: 20 % (calc) — ABNORMAL LOW (ref >25)
PSA, Free: 0.6 ng/mL
PSA, Total: 3 ng/mL (ref <=4.0)

## 2021-02-14 MED ORDER — LEVOTHYROXINE SODIUM 150 MCG PO TABS: ORAL_TABLET | ORAL | 3 refills | Status: DC

## 2021-02-20 ENCOUNTER — Other Ambulatory Visit: Payer: Self-pay | Admitting: Chiropractic Medicine

## 2021-02-20 ENCOUNTER — Ambulatory Visit
Admission: RE | Admit: 2021-02-20 | Discharge: 2021-02-20 | Disposition: A | Discharge status: Home / Self Care | Payer: 59 | Source: Ambulatory Visit | Attending: Chiropractic Medicine | Admitting: Chiropractic Medicine

## 2021-02-20 DIAGNOSIS — M25562 Pain in left knee: Secondary | ICD-10-CM

## 2021-02-21 ENCOUNTER — Other Ambulatory Visit: Payer: Self-pay

## 2021-02-21 ENCOUNTER — Encounter: Payer: Self-pay | Admitting: Orthopedic Surgery

## 2021-02-21 ENCOUNTER — Ambulatory Visit (INDEPENDENT_AMBULATORY_CARE_PROVIDER_SITE_OTHER): Payer: 59 | Admitting: Orthopedic Surgery

## 2021-02-21 ENCOUNTER — Ambulatory Visit (INDEPENDENT_AMBULATORY_CARE_PROVIDER_SITE_OTHER): Payer: 59

## 2021-02-21 VITALS — BP 126/79 | HR 52 | Ht 73.0 in | Wt 192.0 lb

## 2021-02-21 DIAGNOSIS — M79645 Pain in left finger(s): Secondary | ICD-10-CM

## 2021-02-21 NOTE — Progress Notes (Signed)
Office Visit Note   Patient: Timothy [name]           Date of Birth: June 21, 1955           MRN: [id number] Visit Date: 02/21/2021              Requested by: Binnie Rail, MD [place],  [place] [id number] PCP: Binnie Rail, MD   Assessment & Plan: Visit Diagnoses:  1. Pain in left finger(s)     Plan: Discussed with patient that he likely sprained or injured the volar plate at the left index finger PIP joint.  The joint is stable on exam.  He has no pain with radial or lateral deviation of the joint with no instability compared to the contralateral side.  There is no evidence of dorsal subluxation on x-ray today.  There is no evidence of fracture on x-ray today.  He has near full range of motion at the joint.  His pain has improved since the injury so we have decided to continue observation for now.  Follow-Up Instructions: No follow-ups on file.   Orders:  Orders Placed This Encounter  Procedures   XR Finger Index Left   No orders of the defined types were placed in this encounter.     Procedures: No procedures performed   Clinical Data: No additional findings.   Subjective: Chief Complaint  Patient presents with   Left Hand - New Patient (Initial Visit)    This is a 65 year old right-hand-dominant male who presents with left index finger PIP joint pain.  He initially injured his finger and May of this year when he was starting a power washer.  He denies any frank dislocation of the finger at that time.  He noticed some swelling and ecchymosis that gradually improved.  He describes nagging pain in this joint since the injury.  He does note that it has been improving.  This pain is worse with terminal flexion of the finger.  He did not try any immobilization or other treatment.  He denies pain in any other finger.  He denies prior injury to this finger.   Review of Systems  Constitutional: Negative.   Respiratory: Negative.    Cardiovascular: Negative.    Musculoskeletal:  Positive for joint swelling.  Skin: Negative.     Objective: Vital Signs: BP 126/79 (BP Location: Left Arm, Patient Position: Sitting, Cuff Size: Normal)   Pulse (!) 52   Ht '6\' 1"'$  (1.854 m)   Wt 192 lb (87.1 kg)   SpO2 97%   BMI 25.33 kg/m   Physical Exam Constitutional:      Appearance: Normal appearance.  Cardiovascular:     Rate and Rhythm: Normal rate.     Pulses: Normal pulses.  Pulmonary:     Effort: Pulmonary effort is normal.  Skin:    General: Skin is warm and dry.     Capillary Refill: Capillary refill takes 2 to 3 seconds.  Neurological:     General: No focal deficit present.     Mental Status: He is alert.    Left Hand Exam   Tenderness  Left hand tenderness location: Mild global tenderness around PIPJ.  Pain w/ hyperextension.   Muscle Strength  The patient has normal left wrist strength.  Other  Erythema: absent Sensation: normal Pulse: present  Comments:  IF ROM: 0-75 at MP, 0-100 at PIP (0-105 on R side), 0-45 at DIP.  Pain w/ hyperextension of finger.  Moderate swelling.      Specialty Comments:  No specialty comments available.  Imaging: 3V of the L index finger taken today are reviewed and interpreted by me.  They demonstrate no acute or old fracture.  There is no dorsal subluxation of the middle phalanx on the lateral view   PMFS History: Patient Active Problem List   Diagnosis Date Noted   Hyperglycemia 02/13/2021   Poison ivy dermatitis 11/27/2020   Family history of diabetes mellitus in daughter [id number]   Umbilical hernia without obstruction or gangrene 01/04/2018   S/P arthroscopy of left shoulder 07/21/2017   Chronic pain of both shoulders 05/14/2017   Sciatica of left side 10/11/2015   History of basal cell carcinoma (BCC) 09/08/2014   ED (erectile dysfunction) 05/28/2012   Family history of ischemic heart disease 02/20/2012   ELEVATED PROSTATE SPECIFIC ANTIGEN 10/11/2008   Hypothyroidism 08/30/2007    BPH (benign prostatic hyperplasia) 08/30/2007   Past Medical History:  Diagnosis Date   Allergy    seasonal   ED (erectile dysfunction)    Rosanna Randy syndrome    Hypothyroidism     Family History  Problem Relation Age of Onset   Hypothyroidism Father    Colon polyps Father    Heart attack Father 50       smoker   Melanoma Father    Lung cancer Father    Hypothyroidism Mother    Heart attack Mother 89       smoker   Colonic polyp Mother    Hypothyroidism Brother         X 3   Melanoma Brother    Diabetes Daughter        Type 1   Stroke Neg Hx    Colon cancer Neg Hx     Past Surgical History:  Procedure Laterality Date   APPENDECTOMY     BASAL CELL CARCINOMA EXCISION  08/2014   Benign Nevi      Removed x 3/ Dr. Crista Luria    CARPAL TUNNEL RELEASE  5/12/20007   Bilateral ; Dr Fredna Dow   COLONOSCOPY  2005 & 2011   Normal; [facility]   TONSILLECTOMY     Social History   Occupational History   Not on file  Tobacco Use   Smoking status: Never   Smokeless tobacco: Never  Substance and Sexual Activity   Alcohol use: Yes    Alcohol/week: 0.0 standard drinks    Comment:  15 servings of beer or wine   Drug use: No   Sexual activity: Not on file

## 2021-02-27 ENCOUNTER — Other Ambulatory Visit: Payer: Self-pay

## 2021-02-27 ENCOUNTER — Ambulatory Visit
Admission: RE | Admit: 2021-02-27 | Discharge: 2021-02-27 | Disposition: A | Discharge status: Home / Self Care | Payer: 59 | Source: Ambulatory Visit | Attending: Chiropractic Medicine | Admitting: Chiropractic Medicine

## 2021-02-27 DIAGNOSIS — R52 Pain, unspecified: Secondary | ICD-10-CM

## 2021-03-10 ENCOUNTER — Encounter: Payer: Self-pay | Admitting: Gastroenterology

## 2021-04-23 ENCOUNTER — Other Ambulatory Visit: Payer: Self-pay | Admitting: Orthopedic Surgery

## 2021-04-23 DIAGNOSIS — R29898 Other symptoms and signs involving the musculoskeletal system: Secondary | ICD-10-CM

## 2021-04-23 DIAGNOSIS — M25561 Pain in right knee: Secondary | ICD-10-CM

## 2021-05-08 ENCOUNTER — Other Ambulatory Visit: Payer: Self-pay

## 2021-05-08 ENCOUNTER — Ambulatory Visit
Admission: RE | Admit: 2021-05-08 | Discharge: 2021-05-08 | Disposition: A | Discharge status: Home / Self Care | Payer: 59 | Source: Ambulatory Visit | Attending: Orthopedic Surgery | Admitting: Orthopedic Surgery

## 2021-05-08 DIAGNOSIS — M25561 Pain in right knee: Secondary | ICD-10-CM

## 2021-05-08 DIAGNOSIS — R29898 Other symptoms and signs involving the musculoskeletal system: Secondary | ICD-10-CM

## 2021-05-09 DIAGNOSIS — Z9889 Other specified postprocedural states: Secondary | ICD-10-CM | POA: Diagnosis not present

## 2021-05-09 DIAGNOSIS — R29898 Other symptoms and signs involving the musculoskeletal system: Secondary | ICD-10-CM | POA: Diagnosis not present

## 2021-05-09 DIAGNOSIS — M25662 Stiffness of left knee, not elsewhere classified: Secondary | ICD-10-CM | POA: Diagnosis not present

## 2021-05-09 DIAGNOSIS — Z7409 Other reduced mobility: Secondary | ICD-10-CM | POA: Diagnosis not present

## 2021-05-23 DIAGNOSIS — R29898 Other symptoms and signs involving the musculoskeletal system: Secondary | ICD-10-CM | POA: Diagnosis not present

## 2021-05-23 DIAGNOSIS — M25662 Stiffness of left knee, not elsewhere classified: Secondary | ICD-10-CM | POA: Diagnosis not present

## 2021-05-23 DIAGNOSIS — Z9889 Other specified postprocedural states: Secondary | ICD-10-CM | POA: Diagnosis not present

## 2021-05-23 DIAGNOSIS — Z7409 Other reduced mobility: Secondary | ICD-10-CM | POA: Diagnosis not present

## 2021-06-20 DIAGNOSIS — Z7409 Other reduced mobility: Secondary | ICD-10-CM | POA: Diagnosis not present

## 2021-06-20 DIAGNOSIS — R29898 Other symptoms and signs involving the musculoskeletal system: Secondary | ICD-10-CM | POA: Diagnosis not present

## 2021-07-09 ENCOUNTER — Encounter: Payer: Self-pay | Admitting: Gastroenterology

## 2021-08-14 ENCOUNTER — Ambulatory Visit (AMBULATORY_SURGERY_CENTER): Payer: BC Managed Care – PPO | Admitting: *Deleted

## 2021-08-14 ENCOUNTER — Other Ambulatory Visit: Payer: Self-pay

## 2021-08-14 VITALS — Ht 73.0 in | Wt 190.0 lb

## 2021-08-14 DIAGNOSIS — Z86010 Personal history of colonic polyps: Secondary | ICD-10-CM

## 2021-08-14 DIAGNOSIS — Z8371 Family history of colonic polyps: Secondary | ICD-10-CM

## 2021-08-14 MED ORDER — PEG 3350-KCL-NA BICARB-NACL 420 G PO SOLR: 4000.0000 mL | Freq: Once | ORAL | 0 refills | Status: AC

## 2021-08-14 NOTE — Progress Notes (Signed)
No egg or soy allergy known to patient  ? issues known to pt with past sedation with any surgeries or procedures- with appendectomy 30 yrs ago PONV , none since  ?Patient denies ever being told they had issues or difficulty with intubation  ?No FH of Malignant Hyperthermia ?Pt is not on diet pills ?Pt is not on  home 02  ?Pt is not on blood thinners  ?Pt denies issues with constipation  ?No A fib or A flutter ? ?Pt is fully vaccinated  for Covid  ? ? NO PA's for preps discussed with pt In PV today  ?Discussed with pt there will be an out-of-pocket cost for prep and that varies from $0 to 70 +  dollars - pt verbalized understanding  ? ?Due to the COVID-19 pandemic we are asking patients to follow certain guidelines in PV and the [place]   ?Pt aware of COVID protocols and [facility] guidelines  ? ?PV completed over the phone. Pt verified name, DOB, address and insurance during PV today.  ?Pt mailed instruction packet with copy of consent form to read and not return, and instructions.  ?Pt encouraged to call with questions or issues.  ?If pt has My chart, procedure instructions sent via My Chart  ? ?

## 2021-08-15 ENCOUNTER — Encounter: Payer: Self-pay | Admitting: Gastroenterology

## 2021-08-28 ENCOUNTER — Ambulatory Visit (AMBULATORY_SURGERY_CENTER): Payer: BC Managed Care – PPO | Admitting: Gastroenterology

## 2021-08-28 ENCOUNTER — Other Ambulatory Visit: Payer: Self-pay

## 2021-08-28 ENCOUNTER — Encounter: Payer: Self-pay | Admitting: Gastroenterology

## 2021-08-28 VITALS — BP 107/66 | HR 52 | Temp 96.8°F | Resp 14 | Wt 190.0 lb

## 2021-08-28 DIAGNOSIS — Z86010 Personal history of colonic polyps: Secondary | ICD-10-CM

## 2021-08-28 DIAGNOSIS — Z1211 Encounter for screening for malignant neoplasm of colon: Secondary | ICD-10-CM

## 2021-08-28 DIAGNOSIS — Z8371 Family history of colonic polyps: Secondary | ICD-10-CM | POA: Diagnosis not present

## 2021-08-28 MED ORDER — SODIUM CHLORIDE 0.9 % IV SOLN: 500.0000 mL | Freq: Once | INTRAVENOUS | Status: DC

## 2021-08-28 NOTE — Progress Notes (Signed)
VSS, transported to PACU °

## 2021-08-28 NOTE — Progress Notes (Signed)
? ?History & Physical ? ?Primary Care Physician:  Binnie Rail, MD ?Primary Gastroenterologist: Lucio Edward, MD ? ?CHIEF COMPLAINT:  Personal history of colon polyps, Family history of colon polyps ? ?HPI: Timothy [name] is a 66 y.o. male with a personal history of adenomatous colon polyps and family history of colon polyps for colonoscopy. ? ? ?Past Medical History:  ?Diagnosis Date  ? Allergy   ? seasonal  ? ED (erectile dysfunction)   ? Rosanna Randy syndrome   ? Hypothyroidism   ? PONV (postoperative nausea and vomiting)   ? ~ 30 yrs ago with appendectomy  ? ? ?Past Surgical History:  ?Procedure Laterality Date  ? APPENDECTOMY    ? BASAL CELL CARCINOMA EXCISION  08/08/2014  ? Benign Nevi     ? Removed x 3/ Dr. Crista Luria   ? CARPAL TUNNEL RELEASE  5/12/20007  ? Bilateral ; Dr Fredna Dow  ? COLONOSCOPY  2005 & 2011  ? Normal; [facility]  ? MENISCUS REPAIR Bilateral   ? POLYPECTOMY    ? TA x 2 2017  ? SHOULDER SURGERY Bilateral   ? chromium decompression  ? TONSILLECTOMY    ? ? ?Prior to Admission medications   ?Medication Sig Start Date End Date Taking? Authorizing Provider  ?cetirizine (ZYRTEC) 10 MG tablet Take 10 mg by mouth daily.   Yes [provider]  ?ibuprofen (ADVIL) 600 MG tablet Take 600 mg by mouth every 6 (six) hours as needed. 01/30/21  Yes [provider]  ?levothyroxine (SYNTHROID) 150 MCG tablet TAKE 1 AND 1/2 TABLETS EVERY DAY EXCEPT TAKE 1 TABLET ON Tuesday, [place] AND SATURDAY 02/14/21  Yes Burns, Claudina Lick, MD  ?loratadine (CLARITIN) 10 MG tablet Take 10 mg by mouth daily.   Yes [provider]  ?clobetasol cream (TEMOVATE) 2.95 % Apply 1 application. topically 2 (two) times daily. 04/16/21   [provider]  ?metroNIDAZOLE (METROGEL) 0.75 % gel Apply topically 2 (two) times daily. 04/16/21   [provider]  ?tadalafil (CIALIS) 20 MG tablet Take 0.5-1 tablets (10-20 mg total) by mouth every other day as needed for erectile dysfunction. 01/19/19   Binnie Rail, MD  ?triamcinolone (NASACORT) 55 MCG/ACT nasal inhaler 2 sprays by Nasal route daily.   ?Patient not taking: Reported on 08/14/2021    [provider]  ?triamcinolone ointment (KENALOG) 0.1 % Apply topically. 04/05/20   [provider]  ? ? ?Current Outpatient Medications  ?Medication Sig Dispense Refill  ? cetirizine (ZYRTEC) 10 MG tablet Take 10 mg by mouth daily.    ? ibuprofen (ADVIL) 600 MG tablet Take 600 mg by mouth every 6 (six) hours as needed.    ? levothyroxine (SYNTHROID) 150 MCG tablet TAKE 1 AND 1/2 TABLETS EVERY DAY EXCEPT TAKE 1 TABLET ON Tuesday, THURSDAY AND SATURDAY 114 tablet 3  ? loratadine (CLARITIN) 10 MG tablet Take 10 mg by mouth daily.    ? clobetasol cream (TEMOVATE) 1.88 % Apply 1 application. topically 2 (two) times daily.    ? metroNIDAZOLE (METROGEL) 0.75 % gel Apply topically 2 (two) times daily.    ? tadalafil (CIALIS) 20 MG tablet Take 0.5-1 tablets (10-20 mg total) by mouth every other day as needed for erectile dysfunction. 10 tablet 11  ? triamcinolone (NASACORT) 55 MCG/ACT nasal inhaler 2 sprays by Nasal route daily.   (Patient not taking: Reported on 08/14/2021)    ? triamcinolone ointment (KENALOG) 0.1 % Apply topically.    ? ?No current facility-administered  medications for this visit.  ? ? ?Allergies as of 08/28/2021  ? (No Known Allergies)  ? ? ?Family History  ?Problem Relation Age of Onset  ? Colon polyps Mother   ? Hypothyroidism Mother   ? Heart attack Mother 92  ?     smoker  ? Colonic polyp Mother   ? Hypothyroidism Father   ? Colon polyps Father   ? Heart attack Father 29  ?     smoker  ? Melanoma Father   ? Lung cancer Father   ? Hypothyroidism Brother   ?      X 3  ? Melanoma Brother   ? Diabetes Daughter   ?     Type 1  ? Stroke Neg Hx   ? Colon cancer Neg Hx   ? Esophageal cancer Neg Hx   ? Stomach cancer Neg Hx   ? Rectal cancer Neg Hx   ? ? ?Social History  ? ?Socioeconomic History  ? Marital status: Married  ?  Spouse name: Not on file  ?  Number of children: Not on file  ? Years of education: Not on file  ? Highest education level: Not on file  ?Occupational History  ? Not on file  ?Tobacco Use  ? Smoking status: Never  ? Smokeless tobacco: Never  ?Substance and Sexual Activity  ? Alcohol use: Yes  ?  Alcohol/week: 0.0 standard drinks  ?  Comment:  15 servings of beer or wine  ? Drug use: No  ? Sexual activity: Not on file  ?Other Topics Concern  ? Not on file  ?Social History Narrative  ? Exercise - regular 5-7 times, cardio, weights  ? ?Social Determinants of Health  ? ?Financial Resource Strain: Not on file  ?Food Insecurity: Not on file  ?Transportation Needs: Not on file  ?Physical Activity: Not on file  ?Stress: Not on file  ?Social Connections: Not on file  ?Intimate Partner Violence: Not on file  ? ? ?Review of Systems: ? ?All systems reviewed an negative except where noted in HPI. ? ?Gen: Denies any fever, chills, sweats, anorexia, fatigue, weakness, malaise, weight loss, and sleep disorder ?CV: Denies chest pain, angina, palpitations, syncope, orthopnea, PND, peripheral edema, and claudication. ?Resp: Denies dyspnea at rest, dyspnea with exercise, cough, sputum, wheezing, coughing up blood, and pleurisy. ?GI: Denies vomiting blood, jaundice, and fecal incontinence.   Denies dysphagia or odynophagia. ?GU : Denies urinary burning, blood in urine, urinary frequency, urinary hesitancy, nocturnal urination, and urinary incontinence. ?MS: Denies joint pain, limitation of movement, and swelling, stiffness, low back pain, extremity pain. Denies muscle weakness, cramps, atrophy.  ?Derm: Denies rash, itching, dry skin, hives, moles, warts, or unhealing ulcers.  ?Psych: Denies depression, anxiety, memory loss, suicidal ideation, hallucinations, paranoia, and confusion. ?Heme: Denies bruising, bleeding, and enlarged lymph nodes. ?Neuro:  Denies any headaches, dizziness, paresthesias. ?Endo:  Denies any problems with DM, thyroid, adrenal  function. ? ? ?Physical Exam: ?General:  Alert, well-developed, in NAD ?Head:  Normocephalic and atraumatic. ?Eyes:  Sclera clear, no icterus.   Conjunctiva pink. ?Ears:  Normal auditory acuity. ?Mouth:  No deformity or lesions.  ?Neck:  Supple; no masses . ?Lungs:  Clear throughout to auscultation.   No wheezes, crackles, or rhonchi. No acute distress. ?Heart:  Regular rate and rhythm; no murmurs. ?Abdomen:  Soft, nondistended, nontender. No masses, hepatomegaly. No obvious masses.  Normal bowel .    ?Rectal:  Deferred   ?Msk:  Symmetrical without gross deformities.Marland Kitchen ?Pulses:  Normal pulses noted. ?Extremities:  Without edema. ?Neurologic:  Alert and  oriented x4;  grossly normal neurologically. ?Skin:  Intact without significant lesions or rashes. ?Cervical Nodes:  No significant cervical adenopathy. ?Psych:  Alert and cooperative. Normal mood and affect. ? ? ?Impression / Plan:  ? ?Personal history of adenomatous colon polyps and family history of colon polyps for colonoscopy. ? ?Timothy [name] Plan  08/28/2021, 9:03 AM ?See Shea Evans, [facility], to contact our on call provider ? ? ?  ?

## 2021-08-28 NOTE — Progress Notes (Signed)
D.T. vital signs. °

## 2021-08-28 NOTE — Progress Notes (Signed)
Pt's states no medical or surgical changes since previsit or office visit. 

## 2021-08-28 NOTE — Patient Instructions (Addendum)
Handout on hemorrhoids provided. ? ?Repeat colonoscopy in 5 years for surveillance.  ? ?YOU HAD AN ENDOSCOPIC PROCEDURE TODAY AT [facility]:   Refer to the procedure report that was given to you for any specific questions about what was found during the examination.  If the procedure report does not answer your questions, please call your gastroenterologist to clarify.  If you requested that your care partner not be given the details of your procedure findings, then the procedure report has been included in a sealed envelope for you to review at your convenience later. ? ?YOU SHOULD EXPECT: Some feelings of bloating in the abdomen. Passage of more gas than usual.  Walking can help get rid of the air that was put into your GI tract during the procedure and reduce the bloating. If you had a lower endoscopy (such as a colonoscopy or flexible sigmoidoscopy) you may notice spotting of blood in your stool or on the toilet paper. If you underwent a bowel prep for your procedure, you may not have a normal bowel movement for a few days. ? ?Please Note:  You might notice some irritation and congestion in your nose or some drainage.  This is from the oxygen used during your procedure.  There is no need for concern and it should clear up in a day or so. ? ?SYMPTOMS TO REPORT IMMEDIATELY: ? ?Following lower endoscopy (colonoscopy or flexible sigmoidoscopy): ? Excessive amounts of blood in the stool ? Significant tenderness or worsening of abdominal pains ? Swelling of the abdomen that is new, acute ? Fever of 100?F or higher ? ? ?For urgent or emergent issues, a gastroenterologist can be reached at any hour by calling [phone]. ?Do not use MyChart messaging for urgent concerns.  ? ? ?DIET:  We do recommend a small meal at first, but then you may proceed to your regular diet.  Drink plenty of fluids but you should avoid alcoholic beverages for 24 hours. ? ?ACTIVITY:  You should plan to take it easy for the  rest of today and you should NOT DRIVE or use heavy machinery until tomorrow (because of the sedation medicines used during the test).   ? ?FOLLOW UP: ?Our staff will call the number listed on your records 48-72 hours following your procedure to check on you and address any questions or concerns that you may have regarding the information given to you following your procedure. If we do not reach you, we will leave a message.  We will attempt to reach you two times.  During this call, we will ask if you have developed any symptoms of COVID 19. If you develop any symptoms (ie: fever, flu-like symptoms, shortness of breath, cough etc.) before then, please call [phone].  If you test positive for Covid 19 in the 2 weeks post procedure, please call and report this information to [place].   ? ?If any biopsies were taken you will be contacted by phone or by letter within the next 1-3 weeks.  Please call us at [phone] if you have not heard about the biopsies in 3 weeks.  ? ? ?SIGNATURES/CONFIDENTIALITY: ?You and/or your care partner have signed paperwork which will be entered into your electronic medical record.  These signatures attest to the fact that that the information above on your After Visit Summary has been reviewed and is understood.  Full responsibility of the confidentiality of this discharge information lies with you and/or your care-partner. ? ?

## 2021-08-28 NOTE — Op Note (Addendum)
[facility] ?Patient Name: Timothy [name] ?Procedure Date: 08/28/2021 8:57 AM ?MRN: [id number] ?Endoscopist: Ladene Artist , MD ?Age: 66 ?Referring MD:  ?Date of Birth: [birth year] ?Gender: Male ?Account #: [id number] ?Procedure:                Colonoscopy ?Indications:              Surveillance: Personal history of adenomatous  ?                          polyps on last colonoscopy 5 years ago, Family  ?                          history of colon polyps. ?Medicines:                Monitored Anesthesia Care ?Procedure:                Pre-Anesthesia Assessment: ?                          - Prior to the procedure, a History and Physical  ?                          was performed, and patient medications and  ?                          allergies were reviewed. The patient's tolerance of  ?                          previous anesthesia was also reviewed. The risks  ?                          and benefits of the procedure and the sedation  ?                          options and risks were discussed with the patient.  ?                          All questions were answered, and informed consent  ?                          was obtained. Prior Anticoagulants: The patient has  ?                          taken no previous anticoagulant or antiplatelet  ?                          agents. ASA Grade Assessment: II - A patient with  ?                          mild systemic disease. After reviewing the risks  ?                          and benefits, the patient was deemed in  ?  satisfactory condition to undergo the procedure. ?                          After obtaining informed consent, the colonoscope  ?                          was passed under direct vision. Throughout the  ?                          procedure, the patient's blood pressure, pulse, and  ?                          oxygen saturations were monitored continuously. The  ?                          CF [id number] #[id number] was introduced through the  anus  ?                          and advanced to the the cecum, identified by  ?                          appendiceal orifice and ileocecal valve. The  ?                          ileocecal valve, appendiceal orifice, and rectum  ?                          were photographed. The quality of the bowel  ?                          preparation was good. The colonoscopy was performed  ?                          without difficulty. The patient tolerated the  ?                          procedure well. ?Scope In: 9:05:29 AM ?Scope Out: 9:22:07 AM ?Scope Withdrawal Time: 0 hours 13 minutes 52 seconds  ?Total Procedure Duration: 0 hours 16 minutes 38 seconds  ?Findings:                 The perianal and digital rectal examinations were  ?                          normal. ?                          Internal hemorrhoids were found during  ?                          retroflexion. The hemorrhoids were moderate and  ?                          Grade I (internal hemorrhoids that do not prolapse). ?  The exam was otherwise without abnormality on  ?                          direct and retroflexion views. ?Complications:            No immediate complications. Estimated blood loss:  ?                          None. ?Estimated Blood Loss:     Estimated blood loss: none. ?Impression:               - Internal hemorrhoids. ?                          - The examination was otherwise normal on direct  ?                          and retroflexion views. ?                          - No specimens collected. ?Recommendation:           - Repeat colonoscopy in 5 years for surveillance. ?                          - Patient has a contact number available for  ?                          emergencies. The signs and symptoms of potential  ?                          delayed complications were discussed with the  ?                          patient. Return to normal activities tomorrow.  ?                          Written discharge  instructions were provided to the  ?                          patient. ?                          - Resume previous diet. ?                          - Continue present medications. ?Ladene Artist, MD ?08/28/2021 9:27:51 AM ?This report has been signed electronically. ?

## 2021-08-30 ENCOUNTER — Telehealth: Payer: Self-pay | Admitting: *Deleted

## 2021-08-30 NOTE — Telephone Encounter (Signed)
?  Follow up Call- ? ? ?  08/28/2021  ?  8:27 AM  ?Call back number  ?Post procedure Call Back phone  # [phone]  ?Permission to leave phone message Yes  ?  ? ?Patient questions: ? ?Do you have a fever, pain , or abdominal swelling? No. ?Pain Score  0 * ? ?Have you tolerated food without any problems? Yes.   ? ?Have you been able to return to your normal activities? Yes.   ? ?Do you have any questions about your discharge instructions: ?Diet   No. ?Medications  No. ?Follow up visit  No. ? ?Do you have questions or concerns about your Care? No. ? ?Actions: ?* If pain score is 4 or above: ?No action needed, pain <4. ? ? ?

## 2021-10-08 ENCOUNTER — Encounter: Payer: Self-pay | Admitting: Gastroenterology

## 2021-11-21 DIAGNOSIS — H2513 Age-related nuclear cataract, bilateral: Secondary | ICD-10-CM | POA: Diagnosis not present

## 2022-03-21 ENCOUNTER — Other Ambulatory Visit: Payer: Self-pay | Admitting: Internal Medicine

## 2022-04-09 ENCOUNTER — Encounter: Payer: Self-pay | Admitting: Internal Medicine

## 2022-04-09 NOTE — Progress Notes (Signed)
Subjective:    Patient ID: Timothy [name], male    DOB: 08/01/1955, 66 y.o.   MRN: [id number]     HPI Adric is here for a physical exam.   No major change in health.   Going to [place] next [place].    Medications and allergies reviewed with patient and updated if appropriate.  Current Outpatient Medications on File Prior to Visit  Medication Sig Dispense Refill   cetirizine (ZYRTEC) 10 MG tablet Take 10 mg by mouth daily.     clobetasol cream (TEMOVATE) 0.10 % Apply 1 application. topically 2 (two) times daily.     ibuprofen (ADVIL) 600 MG tablet Take 600 mg by mouth every 6 (six) hours as needed.     levothyroxine (SYNTHROID) 150 MCG tablet TAKE 1 AND 1/2 TABLETS EVERY DAY EXCEPT TAKE 1 TABLET ON TUESDAY, THURSDAY AND SATURDAY 114 tablet 3   loratadine (CLARITIN) 10 MG tablet Take 10 mg by mouth daily.     metroNIDAZOLE (METROGEL) 0.75 % gel Apply topically 2 (two) times daily.     tadalafil (CIALIS) 20 MG tablet Take 0.5-1 tablets (10-20 mg total) by mouth every other day as needed for erectile dysfunction. 10 tablet 11   triamcinolone ointment (KENALOG) 0.1 % Apply topically.     No current facility-administered medications on file prior to visit.    Review of Systems  Constitutional:  Negative for chills and fever.  Eyes:  Negative for visual disturbance.  Respiratory:  Negative for cough, shortness of breath and wheezing.   Cardiovascular:  Negative for chest pain, palpitations and leg swelling.  Gastrointestinal:  Negative for abdominal pain, blood in stool, constipation, diarrhea and nausea.       No gerd  Genitourinary:  Negative for difficulty urinating, dysuria and hematuria.  Musculoskeletal:  Positive for arthralgias (mild knees). Negative for back pain.  Skin:  Negative for rash.  Neurological:  Positive for dizziness (occ, assoc with double vision). Negative for light-headedness and headaches.  Psychiatric/Behavioral:  Negative for dysphoric mood. The patient  is not nervous/anxious.        Objective:   Vitals:   04/10/22 0801  BP: 114/72  Pulse: (!) 50  Temp: 98 F (36.7 C)  SpO2: 98%   Filed Weights   04/10/22 0801  Weight: 190 lb (86.2 kg)   Body mass index is 25.07 kg/m.  BP Readings from Last 3 Encounters:  04/10/22 114/72  08/28/21 107/66  02/21/21 126/79    Wt Readings from Last 3 Encounters:  04/10/22 190 lb (86.2 kg)  08/28/21 190 lb (86.2 kg)  08/14/21 190 lb (86.2 kg)      Physical Exam Constitutional: He appears well-developed and well-nourished. No distress.  HENT:  Head: Normocephalic and atraumatic.  Right Ear: External ear normal.  Left Ear: External ear normal.  Mouth/Throat: Oropharynx is clear and moist.  Normal ear canals and TM b/l  Eyes: Conjunctivae and EOM are normal.  Neck: Neck supple. No tracheal deviation present. No thyromegaly present.  No carotid bruit  Cardiovascular: Normal rate, regular rhythm, normal heart sounds and intact distal pulses.   No murmur heard. Pulmonary/Chest: Effort normal and breath sounds normal. No respiratory distress. He has no wheezes. He has no rales.  Abdominal: Soft. He exhibits no distension. Umbilical hernia - ping pong size - nontender, reducible, There is no tenderness.  Genitourinary: deferred  Musculoskeletal: He exhibits no edema.  Lymphadenopathy:   He has no cervical adenopathy.  Skin: Skin is warm  and dry. He is not diaphoretic.  Psychiatric: He has a normal mood and affect. His behavior is normal.         Assessment & Plan:   Physical exam: Screening blood work  ordered Exercise   regular 5-7 gym - cardio, weight Weight normal Substance abuse   none   Reviewed recommended immunizations.  Flu immunization administered today.     Health Maintenance  Topic Date Due   COVID-19 Vaccine (5 - Pfizer risk series) 04/30/2021   INFLUENZA VACCINE  01/07/2022   TETANUS/TDAP  10/10/2025   COLONOSCOPY (Pts 45-65yr Insurance coverage will  need to be confirmed)  08/29/2026   Hepatitis C Screening  Completed   HIV Screening  Completed   Zoster Vaccines- Shingrix  Completed   HPV VACCINES  Aged Out     See Problem List for Assessment and Plan of chronic medical problems.

## 2022-04-09 NOTE — Patient Instructions (Addendum)
Flu immunization administered today.      Blood work was ordered.   The lab is on the first floor.    Medications changes include :   None     Return in about 1 year (around 04/11/2023) for Physical Exam.   Health Maintenance, Male Adopting a healthy lifestyle and getting preventive care are important in promoting health and wellness. Ask your health care provider about: The right schedule for you to have regular tests and exams. Things you can do on your own to prevent diseases and keep yourself healthy. What should I know about diet, weight, and exercise? Eat a healthy diet  Eat a diet that includes plenty of vegetables, fruits, low-fat dairy products, and lean protein. Do not eat a lot of foods that are high in solid fats, added sugars, or sodium. Maintain a healthy weight Body mass index (BMI) is a measurement that can be used to identify possible weight problems. It estimates body fat based on height and weight. Your health care provider can help determine your BMI and help you achieve or maintain a healthy weight. Get regular exercise Get regular exercise. This is one of the most important things you can do for your health. Most adults should: Exercise for at least 150 minutes each week. The exercise should increase your heart rate and make you sweat (moderate-intensity exercise). Do strengthening exercises at least twice a week. This is in addition to the moderate-intensity exercise. Spend less time sitting. Even light physical activity can be beneficial. Watch cholesterol and blood lipids Have your blood tested for lipids and cholesterol at 64 years of age, then have this test every 5 years. You may need to have your cholesterol levels checked more often if: Your lipid or cholesterol levels are high. You are older than 64 years of age. You are at high risk for heart disease. What should I know about cancer screening? Many types of cancers can be detected early  and may often be prevented. Depending on your health history and family history, you may need to have cancer screening at various ages. This may include screening for: Colorectal cancer. Prostate cancer. Skin cancer. Lung cancer. What should I know about heart disease, diabetes, and high blood pressure? Blood pressure and heart disease High blood pressure causes heart disease and increases the risk of stroke. This is more likely to develop in people who have high blood pressure readings or are overweight. Talk with your health care provider about your target blood pressure readings. Have your blood pressure checked: Every 3-5 years if you are 24-77 years of age. Every year if you are 56 years old or older. If you are between the ages of 92 and 78 and are a current or former smoker, ask your health care provider if you should have a one-time screening for abdominal aortic aneurysm (AAA). Diabetes Have regular diabetes screenings. This checks your fasting blood sugar level. Have the screening done: Once every three years after age 76 if you are at a normal weight and have a low risk for diabetes. More often and at a younger age if you are overweight or have a high risk for diabetes. What should I know about preventing infection? Hepatitis B If you have a higher risk for hepatitis B, you should be screened for this virus. Talk with your health care provider to find out if you are at risk for hepatitis B infection. Hepatitis C Blood testing is recommended for: Everyone born  from [place] through 1965. Anyone with known risk factors for hepatitis C. Sexually transmitted infections (STIs) You should be screened each year for STIs, including gonorrhea and chlamydia, if: You are sexually active and are younger than 64 years of age. You are older than 64 years of age and your health care provider tells you that you are at risk for this type of infection. Your sexual activity has changed since you were  last screened, and you are at increased risk for chlamydia or gonorrhea. Ask your health care provider if you are at risk. Ask your health care provider about whether you are at high risk for HIV. Your health care provider may recommend a prescription medicine to help prevent HIV infection. If you choose to take medicine to prevent HIV, you should first get tested for HIV. You should then be tested every 3 months for as long as you are taking the medicine. Follow these instructions at home: Alcohol use Do not drink alcohol if your health care provider tells you not to drink. If you drink alcohol: Limit how much you have to 0-2 drinks a day. Know how much alcohol is in your drink. In the [place] one drink equals one 12 oz bottle of beer (355 mL), one 5 oz glass of wine (148 mL), or one 1 oz glass of hard liquor (44 mL). Lifestyle Do not use any products that contain nicotine or tobacco. These products include cigarettes, chewing tobacco, and vaping devices, such as e-cigarettes. If you need help quitting, ask your health care provider. Do not use street drugs. Do not share needles. Ask your health care provider for help if you need support or information about quitting drugs. General instructions Schedule regular health, dental, and eye exams. Stay current with your vaccines. Tell your health care provider if: You often feel depressed. You have ever been abused or do not feel safe at home. Summary Adopting a healthy lifestyle and getting preventive care are important in promoting health and wellness. Follow your health care provider's instructions about healthy diet, exercising, and getting tested or screened for diseases. Follow your health care provider's instructions on monitoring your cholesterol and blood pressure. This information is not intended to replace advice given to you by your health care provider. Make sure you discuss any questions you have with your health care  provider. Document Revised: 10/15/2020 Document Reviewed: 10/15/2020 Elsevier Patient Education  [place].

## 2022-04-10 ENCOUNTER — Ambulatory Visit (INDEPENDENT_AMBULATORY_CARE_PROVIDER_SITE_OTHER): Payer: BC Managed Care – PPO | Admitting: Internal Medicine

## 2022-04-10 VITALS — BP 114/72 | HR 50 | Temp 98.0°F | Ht 73.0 in | Wt 190.0 lb

## 2022-04-10 DIAGNOSIS — R739 Hyperglycemia, unspecified: Secondary | ICD-10-CM

## 2022-04-10 DIAGNOSIS — Z0000 Encounter for general adult medical examination without abnormal findings: Secondary | ICD-10-CM

## 2022-04-10 DIAGNOSIS — N529 Male erectile dysfunction, unspecified: Secondary | ICD-10-CM

## 2022-04-10 DIAGNOSIS — E038 Other specified hypothyroidism: Secondary | ICD-10-CM

## 2022-04-10 DIAGNOSIS — K429 Umbilical hernia without obstruction or gangrene: Secondary | ICD-10-CM

## 2022-04-10 DIAGNOSIS — Z125 Encounter for screening for malignant neoplasm of prostate: Secondary | ICD-10-CM | POA: Diagnosis not present

## 2022-04-10 DIAGNOSIS — Z23 Encounter for immunization: Secondary | ICD-10-CM

## 2022-04-10 LAB — CBC WITH DIFFERENTIAL/PLATELET
Basophils Absolute: 0 10*3/uL (ref 0.0–0.1)
Basophils Relative: 0.4 % (ref 0.0–3.0)
Eosinophils Absolute: 0.2 10*3/uL (ref 0.0–0.7)
Eosinophils Relative: 3.3 % (ref 0.0–5.0)
HCT: 42.7 % (ref 39.0–52.0)
Hemoglobin: 14.2 g/dL (ref 13.0–17.0)
Lymphocytes Relative: 39 % (ref 12.0–46.0)
Lymphs Abs: 1.9 10*3/uL (ref 0.7–4.0)
MCHC: 33.3 g/dL (ref 30.0–36.0)
MCV: 95.4 fl (ref 78.0–100.0)
Monocytes Absolute: 0.6 10*3/uL (ref 0.1–1.0)
Monocytes Relative: 12 % (ref 3.0–12.0)
Neutro Abs: 2.2 10*3/uL (ref 1.4–7.7)
Neutrophils Relative %: 45.3 % (ref 43.0–77.0)
Platelets: 194 10*3/uL (ref 150.0–400.0)
RBC: 4.48 Mil/uL (ref 4.22–5.81)
RDW: 14.1 % (ref 11.5–15.5)
WBC: 4.8 10*3/uL (ref 4.0–10.5)

## 2022-04-10 LAB — COMPREHENSIVE METABOLIC PANEL
ALT: 22 U/L (ref 0–53)
AST: 24 U/L (ref 0–37)
Albumin: 4.3 g/dL (ref 3.5–5.2)
Alkaline Phosphatase: 36 U/L — ABNORMAL LOW (ref 39–117)
BUN: 14 mg/dL (ref 6–23)
CO2: 28 mEq/L (ref 19–32)
Calcium: 9.5 mg/dL (ref 8.4–10.5)
Chloride: 105 mEq/L (ref 96–112)
Creatinine, Ser: 1.15 mg/dL (ref 0.40–1.50)
GFR: 67.21 mL/min (ref >60.00)
Glucose, Bld: 82 mg/dL (ref 70–99)
Potassium: 4.5 mEq/L (ref 3.5–5.1)
Sodium: 141 mEq/L (ref 135–145)
Total Bilirubin: 0.6 mg/dL (ref 0.2–1.2)
Total Protein: 6.7 g/dL (ref 6.0–8.3)

## 2022-04-10 LAB — LIPID PANEL
Cholesterol: 191 mg/dL (ref 0–200)
HDL: 93.5 mg/dL (ref >39.00)
LDL Cholesterol: 85 mg/dL (ref 0–99)
NonHDL: 97.24
Total CHOL/HDL Ratio: 2
Triglycerides: 59 mg/dL (ref 0.0–149.0)
VLDL: 11.8 mg/dL (ref 0.0–40.0)

## 2022-04-10 LAB — HEMOGLOBIN A1C: Hgb A1c MFr Bld: 5.8 % (ref 4.6–6.5)

## 2022-04-10 LAB — TSH: TSH: 0.67 u[IU]/mL (ref 0.35–5.50)

## 2022-04-10 LAB — PSA: PSA: 1.66 ng/mL (ref 0.10–4.00)

## 2022-04-10 MED ORDER — SILDENAFIL CITRATE 100 MG PO TABS
50.0000 mg | ORAL_TABLET | Freq: Every day | ORAL | 11 refills | Status: DC | PRN
Start: 2022-04-10 — End: 2023-05-05

## 2022-04-10 NOTE — Assessment & Plan Note (Addendum)
Chronic Cialis 10-20 mg not as effective Will try viagra again - 50-100 mg daily prn

## 2022-04-10 NOTE — Assessment & Plan Note (Signed)
Chronic Check a1c Low sugar / carb diet Stressed regular exercise  

## 2022-04-10 NOTE — Addendum Note (Signed)
Addended by: Marcina Millard on: 04/10/2022 01:03 PM   Modules accepted: Orders

## 2022-04-10 NOTE — Assessment & Plan Note (Signed)
Chronic  Clinically euthyroid Check tsh and will titrate med dose if needed Currently taking levothyroxine 150 mcg 3 days a week, 225 mcg 4 days a week

## 2022-04-10 NOTE — Assessment & Plan Note (Addendum)
Chronic Minor discomfort at times Continue to monitor - if worsens will refer to surgery

## 2022-04-23 DIAGNOSIS — L821 Other seborrheic keratosis: Secondary | ICD-10-CM | POA: Diagnosis not present

## 2022-04-23 DIAGNOSIS — D239 Other benign neoplasm of skin, unspecified: Secondary | ICD-10-CM | POA: Diagnosis not present

## 2022-04-23 DIAGNOSIS — L814 Other melanin hyperpigmentation: Secondary | ICD-10-CM | POA: Diagnosis not present

## 2022-04-23 DIAGNOSIS — D225 Melanocytic nevi of trunk: Secondary | ICD-10-CM | POA: Diagnosis not present

## 2022-07-22 DIAGNOSIS — Z23 Encounter for immunization: Secondary | ICD-10-CM | POA: Diagnosis not present

## 2022-08-28 DIAGNOSIS — K080 Exfoliation of teeth due to systemic causes: Secondary | ICD-10-CM | POA: Diagnosis not present

## 2022-11-26 DIAGNOSIS — H2513 Age-related nuclear cataract, bilateral: Secondary | ICD-10-CM | POA: Diagnosis not present

## 2023-01-19 ENCOUNTER — Ambulatory Visit: Payer: Self-pay | Admitting: Surgery

## 2023-01-19 DIAGNOSIS — K429 Umbilical hernia without obstruction or gangrene: Secondary | ICD-10-CM | POA: Diagnosis not present

## 2023-01-19 NOTE — H&P (Signed)
Subjective   Chief Complaint: New Consultation (HERNIA)   PCP - Dr. Eileen Stanford Referring - Thereasa Distance, [place]   History of Present Illness: Timothy [name] is a 67 y.o. male who is seen today as an office consultation at the request of Dr. Lawerance Bach for evaluation of New Consultation (HERNIA) .    This is a 67 year old male in excellent health who presents with a 12-year history of an umbilical hernia.  This has become larger and causes occasional discomfort.  He denies any obstructive symptoms.  He remains reducible although is uncomfortable to palpate.   Review of Systems: A complete review of systems was obtained from the patient.  I have reviewed this information and discussed as appropriate with the patient.  See HPI as well for other ROS.  Review of Systems  Constitutional: Negative.   HENT: Negative.    Eyes: Negative.   Respiratory: Negative.    Cardiovascular: Negative.   Gastrointestinal: Negative.   Genitourinary: Negative.   Musculoskeletal: Negative.   Skin: Negative.   Neurological: Negative.   Endo/Heme/Allergies: Negative.   Psychiatric/Behavioral: Negative.        Medical History: Past Medical History:  Diagnosis Date   Thyroid disease     Patient Active Problem List  Diagnosis   BPH (benign prostatic hyperplasia)   ED (erectile dysfunction)   History of basal cell carcinoma (BCC)   Hyperglycemia   Hypothyroidism   Umbilical hernia without obstruction or gangrene    Past Surgical History:  Procedure Laterality Date   KNEE SURGERY     SHOULDER SURGERY     WRIST SURGERY       No Known Allergies  Current Outpatient Medications on File Prior to Visit  Medication Sig Dispense Refill   levothyroxine (SYNTHROID) 150 MCG tablet      No current facility-administered medications on file prior to visit.    Family History  Problem Relation Age of Onset   Coronary Artery Disease (Blocked arteries around heart) Mother    Coronary Artery Disease  (Blocked arteries around heart) Father      Social History   Tobacco Use  Smoking Status Never  Smokeless Tobacco Never     Social History   Socioeconomic History   Marital status: Married  Tobacco Use   Smoking status: Never   Smokeless tobacco: Never  Vaping Use   Vaping status: Never Used  Substance and Sexual Activity   Alcohol use: Yes   Drug use: Never    Objective:    Vitals:   01/19/23 1059  BP: (!) 149/91  Pulse: 57  Temp: 36.2 C (97.2 F)  SpO2: 99%  Weight: 87 kg (191 lb 12.8 oz)  Height: 185.4 cm (6\' 1" )  PainSc: 0-No pain    Body mass index is 25.3 kg/m.  Physical Exam   Constitutional:  WDWN in NAD, conversant, no obvious deformities; lying in bed comfortably Eyes:  Pupils equal, round; sclera anicteric; moist conjunctiva; no lid lag HENT:  Oral mucosa moist; good dentition  Neck:  No masses palpated, trachea midline; no thyromegaly Lungs:  CTA bilaterally; normal respiratory effort CV:  Regular rate and rhythm; no murmurs; extremities well-perfused with no edema Abd:  +bowel sounds, soft, non-tender, no palpable organomegaly; protruding umbilical hernia at the upper half of the umbilicus.  This is partially reducible.  The defect seems to be about 2 cm in diameter.  The hernia bulge enlarges when he is standing with Valsalva. Musc: Normal gait; no apparent clubbing or  cyanosis in extremities Lymphatic:  No palpable cervical or axillary lymphadenopathy Skin:  Warm, dry; no sign of jaundice Psychiatric - alert and oriented x 4; calm mood and affect    Assessment and Plan:  Diagnoses and all orders for this visit:  Umbilical hernia without obstruction or gangrene     Recommend umbilical hernia repair with mesh.The surgical procedure has been discussed with the patient.  Potential risks, benefits, alternative treatments, and expected outcomes have been explained.  All of the patient's questions at this time have been answered.  The likelihood  of reaching the patient's treatment goal is good.  The patient understands the proposed surgical procedure and wishes to proceed.     Yeilyn Gent Delbert Harness, MD  01/19/2023 11:25 AM

## 2023-03-23 ENCOUNTER — Other Ambulatory Visit: Payer: Self-pay | Admitting: Internal Medicine

## 2023-04-07 DIAGNOSIS — K080 Exfoliation of teeth due to systemic causes: Secondary | ICD-10-CM | POA: Diagnosis not present

## 2023-04-08 ENCOUNTER — Other Ambulatory Visit: Payer: Self-pay | Admitting: Internal Medicine

## 2023-04-08 DIAGNOSIS — K429 Umbilical hernia without obstruction or gangrene: Secondary | ICD-10-CM

## 2023-04-16 ENCOUNTER — Encounter: Payer: BC Managed Care – PPO | Admitting: Internal Medicine

## 2023-05-04 ENCOUNTER — Encounter: Payer: Self-pay | Admitting: Internal Medicine

## 2023-05-04 NOTE — Progress Notes (Unsigned)
Subjective:    Patient ID: Timothy [name], male    DOB: 1955-09-09, 67 y.o.   MRN: [id number]     HPI Timothy [name] is here for a welcome to Medicare wellness exam and physical exam.  Here for a welcome to medicare wellness exam.   I have personally reviewed and have noted 1.         The patient's medical and social history 2.         Their use of alcohol, tobacco or illicit drugs 3.         Their current medications and supplements 4.         The patient's functional ability including ADL's, fall risks, home                 safety risk and hearing or visual impairment. 5.         Diet and physical activities 6.         Evidence for depression or mood disorders 7.         Care team reviewed  - dermatology - dr Sharyn Lull, chiropractor, eye doctor    Are there smokers in your home (other than you)? No  Risk Factors Exercise:  regular - goes to gym Dietary issues discussed: does some intermittent fasting.  Overall diet is balanced-eats a little bit of everything and typically eats very healthy  Vitamin and supplement use:  fish oil, MVI  Opiod use:   N/A   Cardiac risk factors: advanced age, hyperlipidemia  Depression Screen  Have you felt down, depressed or hopeless? No  Have you felt little interest or pleasure in doing things?  No  Activities of Daily Living In your present state of health, do you have any difficulty performing the following activities?:  Driving? No Managing money?  No Feeding yourself? No Getting from bed to chair? No Climbing a flight of stairs? No Preparing food and eating?: No Bathing or showering? No Getting dressed: No Getting to/using the toilet? No Moving around from place to place: No In the past year have you fallen or had a near fall?: No   Are you sexually active?  yes  Do you have more than one partner?  No  Hearing Difficulties: No Do you often ask people to speak up or repeat themselves? No Do you experience ringing or noises in  your ears? No Do you have difficulty understanding soft or whispered voices? No Vision:              Any change in vision: No             Up to date with eye exam: yes  Memory:  Do you feel that you have a problem with memory? No  Do you often misplace items? No  Do you feel safe at home?  Yes  Cognitive Testing  Alert, Orientated? Yes  Normal Appearance? Yes  Recall of three objects?  Yes - table, car,cloud - 3/3  Can perform simple calculations? Yes  Displays appropriate judgment? Yes  Can read the correct time from a watch face? Yes   Advanced Directives have been discussed with the patient? Yes - all in place   Past Medical History:  Diagnosis Date   Allergy    seasonal   ED (erectile dysfunction)    Sullivan Lone syndrome    Hypothyroidism    PONV (postoperative nausea and vomiting)    ~ 30 yrs ago with appendectomy  Social History   Socioeconomic History   Marital status: Married    Spouse name: Not on file   Number of children: Not on file   Years of education: Not on file   Highest education level: Bachelor's degree (e.g., BA, AB, BS)  Occupational History   Not on file  Tobacco Use   Smoking status: Never   Smokeless tobacco: Never  Substance and Sexual Activity   Alcohol use: Yes    Alcohol/week: 0.0 standard drinks of alcohol    Comment:  15 servings of beer or wine   Drug use: No   Sexual activity: Not on file  Other Topics Concern   Not on file  Social History Narrative   Exercise - regular 5-7 times, cardio, weights   Social Determinants of Health   Financial Resource Strain: Low Risk  (05/02/2023)   Overall Financial Resource Strain (CARDIA)    Difficulty of Paying Living Expenses: Not hard at all  Food Insecurity: No Food Insecurity (05/02/2023)   Hunger Vital Sign    Worried About Running Out of Food in the Last Year: Never true    Ran Out of Food in the Last Year: Never true  Transportation Needs: No Transportation Needs (05/02/2023)    PRAPARE - Administrator, Civil Service (Medical): No    Lack of Transportation (Non-Medical): No  Physical Activity: Sufficiently Active (05/02/2023)   Exercise Vital Sign    Days of Exercise per Week: 6 days    Minutes of Exercise per Session: 60 min  Stress: No Stress Concern Present (05/02/2023)   [employer] of Occupational Health - Occupational Stress Questionnaire    Feeling of Stress : Not at all  Social Connections: Socially Integrated (05/02/2023)   Social Connection and Isolation Panel [NHANES]    Frequency of Communication with Friends and Family: Twice a week    Frequency of Social Gatherings with Friends and Family: Once a week    Attends Religious Services: More than 4 times per year    Active Member of [employer] or Organizations: Yes    Attends Engineer, structural: More than 4 times per year    Marital Status: Married    Family History  Problem Relation Age of Onset   Colon polyps Mother    Hypothyroidism Mother    Heart attack Mother 84       smoker   Colonic polyp Mother    Hypothyroidism Father    Colon polyps Father    Heart attack Father 33       smoker   Melanoma Father    Lung cancer Father    Hypothyroidism Brother         X 3   Melanoma Brother    Diabetes Daughter        Type 1   Stroke Neg Hx    Colon cancer Neg Hx    Esophageal cancer Neg Hx    Stomach cancer Neg Hx    Rectal cancer Neg Hx        He has retired.    Has nagging URI going on - not preventing him from doing anything.  Has some burning in airway with exercise - can still exercise.  Maybe getting better.   Umbilical hernia surgery in one week.    Medications and allergies reviewed with patient and updated if appropriate.  Current Outpatient Medications on File Prior to Visit  Medication Sig Dispense Refill   cetirizine (ZYRTEC) 10 MG tablet  Take 10 mg by mouth daily.     clobetasol cream (TEMOVATE) 0.05 % Apply 1 application. topically 2 (two)  times daily.     ibuprofen (ADVIL) 600 MG tablet Take 600 mg by mouth every 6 (six) hours as needed.     levothyroxine (SYNTHROID) 150 MCG tablet TAKE 1.5 TABLETS BY MOUTH DAILY EXCEPT TAKE 1 TABLET ON TUESDAYS, THURSDAYS, AND SATURDAYS 114 tablet 3   loratadine (CLARITIN) 10 MG tablet Take 10 mg by mouth daily.     metroNIDAZOLE (METROGEL) 0.75 % gel Apply topically 2 (two) times daily.     triamcinolone ointment (KENALOG) 0.1 % Apply topically.     No current facility-administered medications on file prior to visit.    Review of Systems  Constitutional:  Negative for fever.  HENT:  Positive for congestion (in morning only) and postnasal drip. Negative for ear pain, sinus pressure and sore throat.   Eyes:  Negative for visual disturbance.  Respiratory:  Positive for cough and chest tightness (minimal - burning sensation in chest). Negative for shortness of breath and wheezing.   Cardiovascular:  Negative for chest pain, palpitations and leg swelling.  Gastrointestinal:  Negative for abdominal pain, blood in stool, constipation and diarrhea.       No gerd  Genitourinary:  Positive for difficulty urinating (occ  - difficulty). Negative for dysuria and hematuria.  Musculoskeletal:  Positive for back pain (lumbar pain). Negative for arthralgias.  Skin:  Negative for rash.  Neurological:  Negative for light-headedness and headaches.  Psychiatric/Behavioral:  Negative for dysphoric mood. The patient is not nervous/anxious.        Objective:   Vitals:   05/05/23 0820  BP: 116/70  Pulse: (!) 52  Temp: 98.4 F (36.9 C)  SpO2: 97%   Filed Weights   05/05/23 0820  Weight: 194 lb (88 kg)   Body mass index is 25.6 kg/m.  BP Readings from Last 3 Encounters:  05/05/23 116/70  04/10/22 114/72  08/28/21 107/66    Wt Readings from Last 3 Encounters:  05/05/23 194 lb (88 kg)  04/10/22 190 lb (86.2 kg)  08/28/21 190 lb (86.2 kg)    Vision Screening   Right eye Left eye Both eyes   Without correction 20/25 20/20 20/20   With correction 20/20 20/20 20/20        Physical Exam Constitutional: He appears well-developed and well-nourished. No distress.  HENT:  Head: Normocephalic and atraumatic.  Right Ear: External ear normal.  Left Ear: External ear normal.  Normal ear canals and TM b/l  Mouth/Throat: Oropharynx is clear and moist. Eyes: Conjunctivae and EOM are normal.  Neck: Neck supple. No tracheal deviation present. No thyromegaly present.  No carotid bruit  Cardiovascular: Normal rate, regular rhythm, normal heart sounds and intact distal pulses.   No murmur heard.  No lower extremity edema. Pulmonary/Chest: Effort normal and breath sounds normal. No respiratory distress. He has no wheezes. He has no rales.  Abdominal: Soft. He exhibits no distension. There is no tenderness.  Genitourinary: deferred  Lymphadenopathy:   He has no cervical adenopathy.  Skin: Skin is warm and dry. He is not diaphoretic.  Psychiatric: He has a normal mood and affect. His behavior is normal.         Assessment & Plan:    Wellness Exam: Immunizations-flu vaccine today, will get pneumonia vaccine sometime in the next few months.  Reviewed other vaccines he is eligible for Colonoscopy-up-to-date Eye exam-up-to-date-no visual changes Hearing loss-no hearing loss or  concerns Memory concerns/difficulties-he denies any concerns regarding memory and no evidence of memory issues Independent of ADLs-fully independent Stressed the importance of regular exercise-he does exercise regularly and has always exercise regularly Has a history of skin cancer.  Discussed skin cancer prevention.  He does see a dermatologist on an annual basis. 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  Will contact him to see about possibly doing an echocardiogram to confirm  Patient received copy of preventative  screening tests/immunizations recommended for the next 5-10 years.    Physical exam: Screening blood work  ordered Exercise   regular-goes to gym Weight  normal Substance abuse   none   Reviewed recommended immunizations.  Flu vaccine given today.  He will get pneumonia vaccine at some point soon   Health Maintenance  Topic Date Due   Medicare Annual Wellness (AWV)  Done today   Pneumonia Vaccine 39+ Years old (1 of 1 - PCV) 05/04/2024 (Originally 09/02/2022)   DTaP/Tdap/Td (2 - Td or Tdap) 10/10/2025   Colonoscopy  08/29/2026   INFLUENZA VACCINE  Completed   Hepatitis C Screening  Completed   HIV Screening  Completed   Zoster Vaccines- Shingrix  Completed   HPV VACCINES  Aged Out   COVID-19 Vaccine  Discontinued     See Problem List for Assessment and Plan of chronic medical problems.

## 2023-05-04 NOTE — Patient Instructions (Addendum)
Timothy [name] , Thank you for taking time to come for your Medicare Wellness Visit. I appreciate your ongoing commitment to your health goals. Please review the following plan we discussed and let me know if I can assist you in the future.   These are the goals we discussed:  Goals   None     This is a list of the screening recommended for you and due dates:  Health Maintenance  Topic Date Due   Medicare Annual Wellness Visit  Never done   Flu Shot  01/08/2023   Pneumonia Vaccine (1 of 1 - PCV) 05/04/2024*   DTaP/Tdap/Td vaccine (2 - Td or Tdap) 10/10/2025   Colon Cancer Screening  08/29/2026   Hepatitis C Screening  Completed   HIV Screening  Completed   Zoster (Shingles) Vaccine  Completed   HPV Vaccine  Aged Out   COVID-19 Vaccine  Discontinued  *Topic was postponed. The date shown is not the original due date.      Flu immunization administered today.     Blood work was ordered.       Medications changes include :   None     Return in about 1 year (around 05/04/2024) for Physical Exam.    Health Maintenance, Male Adopting a healthy lifestyle and getting preventive care are important in promoting health and wellness. Ask your health care provider about: The right schedule for you to have regular tests and exams. Things you can do on your own to prevent diseases and keep yourself healthy. What should I know about diet, weight, and exercise? Eat a healthy diet  Eat a diet that includes plenty of vegetables, fruits, low-fat dairy products, and lean protein. Do not eat a lot of foods that are high in solid fats, added sugars, or sodium. Maintain a healthy weight Body mass index (BMI) is a measurement that can be used to identify possible weight problems. It estimates body fat based on height and weight. Your health care provider can help determine your BMI and help you achieve or maintain a healthy weight. Get regular exercise Get regular exercise. This is one  of the most important things you can do for your health. Most adults should: Exercise for at least 150 minutes each week. The exercise should increase your heart rate and make you sweat (moderate-intensity exercise). Do strengthening exercises at least twice a week. This is in addition to the moderate-intensity exercise. Spend less time sitting. Even light physical activity can be beneficial. Watch cholesterol and blood lipids Have your blood tested for lipids and cholesterol at 65 years of age, then have this test every 5 years. You may need to have your cholesterol levels checked more often if: Your lipid or cholesterol levels are high. You are older than 65 years of age. You are at high risk for heart disease. What should I know about cancer screening? Many types of cancers can be detected early and may often be prevented. Depending on your health history and family history, you may need to have cancer screening at various ages. This may include screening for: Colorectal cancer. Prostate cancer. Skin cancer. Lung cancer. What should I know about heart disease, diabetes, and high blood pressure? Blood pressure and heart disease High blood pressure causes heart disease and increases the risk of stroke. This is more likely to develop in people who have high blood pressure readings or are overweight. Talk with your health care provider about your target blood pressure  readings. Have your blood pressure checked: Every 3-5 years if you are 27-71 years of age. Every year if you are 80 years old or older. If you are between the ages of 49 and 92 and are a current or former smoker, ask your health care provider if you should have a one-time screening for abdominal aortic aneurysm (AAA). Diabetes Have regular diabetes screenings. This checks your fasting blood sugar level. Have the screening done: Once every three years after age 34 if you are at a normal weight and have a low risk for  diabetes. More often and at a younger age if you are overweight or have a high risk for diabetes. What should I know about preventing infection? Hepatitis B If you have a higher risk for hepatitis B, you should be screened for this virus. Talk with your health care provider to find out if you are at risk for hepatitis B infection. Hepatitis C Blood testing is recommended for: Everyone born from [birth year] through 1963. Anyone with known risk factors for hepatitis C. Sexually transmitted infections (STIs) You should be screened each year for STIs, including gonorrhea and chlamydia, if: You are sexually active and are younger than 65 years of age. You are older than 65 years of age and your health care provider tells you that you are at risk for this type of infection. Your sexual activity has changed since you were last screened, and you are at increased risk for chlamydia or gonorrhea. Ask your health care provider if you are at risk. Ask your health care provider about whether you are at high risk for HIV. Your health care provider may recommend a prescription medicine to help prevent HIV infection. If you choose to take medicine to prevent HIV, you should first get tested for HIV. You should then be tested every 3 months for as long as you are taking the medicine. Follow these instructions at home: Alcohol use Do not drink alcohol if your health care provider tells you not to drink. If you drink alcohol: Limit how much you have to 0-2 drinks a day. Know how much alcohol is in your drink. In the [place] one drink equals one 12 oz bottle of beer (355 mL), one 5 oz glass of wine (148 mL), or one 1 oz glass of hard liquor (44 mL). Lifestyle Do not use any products that contain nicotine or tobacco. These products include cigarettes, chewing tobacco, and vaping devices, such as e-cigarettes. If you need help quitting, ask your health care provider. Do not use street drugs. Do not share needles. Ask  your health care provider for help if you need support or information about quitting drugs. General instructions Schedule regular health, dental, and eye exams. Stay current with your vaccines. Tell your health care provider if: You often feel depressed. You have ever been abused or do not feel safe at home. Summary Adopting a healthy lifestyle and getting preventive care are important in promoting health and wellness. Follow your health care provider's instructions about healthy diet, exercising, and getting tested or screened for diseases. Follow your health care provider's instructions on monitoring your cholesterol and blood pressure. This information is not intended to replace advice given to you by your health care provider. Make sure you discuss any questions you have with your health care provider. Document Revised: 10/15/2020 Document Reviewed: 10/15/2020 Elsevier Patient Education  2024 [employer].

## 2023-05-05 ENCOUNTER — Encounter: Payer: Self-pay | Admitting: Internal Medicine

## 2023-05-05 ENCOUNTER — Ambulatory Visit: Payer: Medicare Other | Admitting: Internal Medicine

## 2023-05-05 VITALS — BP 116/70 | HR 52 | Temp 98.4°F | Ht 73.0 in | Wt 194.0 lb

## 2023-05-05 DIAGNOSIS — Z0000 Encounter for general adult medical examination without abnormal findings: Secondary | ICD-10-CM

## 2023-05-05 DIAGNOSIS — Z125 Encounter for screening for malignant neoplasm of prostate: Secondary | ICD-10-CM

## 2023-05-05 DIAGNOSIS — N529 Male erectile dysfunction, unspecified: Secondary | ICD-10-CM | POA: Diagnosis not present

## 2023-05-05 DIAGNOSIS — Z23 Encounter for immunization: Secondary | ICD-10-CM | POA: Diagnosis not present

## 2023-05-05 DIAGNOSIS — E038 Other specified hypothyroidism: Secondary | ICD-10-CM

## 2023-05-05 DIAGNOSIS — K429 Umbilical hernia without obstruction or gangrene: Secondary | ICD-10-CM

## 2023-05-05 DIAGNOSIS — R739 Hyperglycemia, unspecified: Secondary | ICD-10-CM

## 2023-05-05 LAB — CBC WITH DIFFERENTIAL/PLATELET
Basophils Absolute: 0 10*3/uL (ref 0.0–0.1)
Basophils Relative: 0.4 % (ref 0.0–3.0)
Eosinophils Absolute: 0.2 10*3/uL (ref 0.0–0.7)
Eosinophils Relative: 3.1 % (ref 0.0–5.0)
HCT: 43.9 % (ref 39.0–52.0)
Hemoglobin: 14.5 g/dL (ref 13.0–17.0)
Lymphocytes Relative: 35.5 % (ref 12.0–46.0)
Lymphs Abs: 1.8 10*3/uL (ref 0.7–4.0)
MCHC: 33 g/dL (ref 30.0–36.0)
MCV: 96.5 fL (ref 78.0–100.0)
Monocytes Absolute: 0.6 10*3/uL (ref 0.1–1.0)
Monocytes Relative: 11.9 % (ref 3.0–12.0)
Neutro Abs: 2.5 10*3/uL (ref 1.4–7.7)
Neutrophils Relative %: 49.1 % (ref 43.0–77.0)
Platelets: 213 10*3/uL (ref 150.0–400.0)
RBC: 4.54 Mil/uL (ref 4.22–5.81)
RDW: 14.1 % (ref 11.5–15.5)
WBC: 5 10*3/uL (ref 4.0–10.5)

## 2023-05-05 LAB — COMPREHENSIVE METABOLIC PANEL
ALT: 19 U/L (ref 0–53)
AST: 21 U/L (ref 0–37)
Albumin: 4.5 g/dL (ref 3.5–5.2)
Alkaline Phosphatase: 39 U/L (ref 39–117)
BUN: 19 mg/dL (ref 6–23)
CO2: 28 meq/L (ref 19–32)
Calcium: 9.7 mg/dL (ref 8.4–10.5)
Chloride: 106 meq/L (ref 96–112)
Creatinine, Ser: 1.12 mg/dL (ref 0.40–1.50)
GFR: 68.86 mL/min (ref >60.00)
Glucose, Bld: 94 mg/dL (ref 70–99)
Potassium: 4.3 meq/L (ref 3.5–5.1)
Sodium: 143 meq/L (ref 135–145)
Total Bilirubin: 0.8 mg/dL (ref 0.2–1.2)
Total Protein: 6.9 g/dL (ref 6.0–8.3)

## 2023-05-05 LAB — LIPID PANEL
Cholesterol: 189 mg/dL (ref 0–200)
HDL: 87.3 mg/dL (ref >39.00)
LDL Cholesterol: 89 mg/dL (ref 0–99)
NonHDL: 102.11
Total CHOL/HDL Ratio: 2
Triglycerides: 64 mg/dL (ref 0.0–149.0)
VLDL: 12.8 mg/dL (ref 0.0–40.0)

## 2023-05-05 LAB — HEMOGLOBIN A1C: Hgb A1c MFr Bld: 5.8 % (ref 4.6–6.5)

## 2023-05-05 LAB — TSH: TSH: 0.12 u[IU]/mL — ABNORMAL LOW (ref 0.35–5.50)

## 2023-05-05 LAB — PSA, MEDICARE: PSA: 3.34 ng/mL (ref 0.10–4.00)

## 2023-05-05 MED ORDER — TADALAFIL 20 MG PO TABS: 20.0000 mg | ORAL_TABLET | ORAL | 11 refills | Status: AC | PRN

## 2023-05-05 NOTE — Assessment & Plan Note (Signed)
Chronic Check a1c Low sugar / carb diet Stressed regular exercise   

## 2023-05-05 NOTE — Assessment & Plan Note (Signed)
To have surgery in 1 week.

## 2023-05-05 NOTE — Assessment & Plan Note (Signed)
Chronic Continue viagra 50-100 mg daily prn

## 2023-05-05 NOTE — Assessment & Plan Note (Signed)
Chronic  Clinically euthyroid Check tsh and will titrate med dose if needed Currently taking levothyroxine 150 mcg 3 days a week, 225 mcg 4 days a week

## 2023-05-05 NOTE — Addendum Note (Signed)
Addended by: Pincus Sanes on: 05/05/2023 12:38 PM   Modules accepted: Orders

## 2023-05-09 ENCOUNTER — Other Ambulatory Visit: Payer: Self-pay | Admitting: Internal Medicine

## 2023-05-09 MED ORDER — LEVOTHYROXINE SODIUM 150 MCG PO TABS: ORAL_TABLET | ORAL | Status: DC

## 2023-05-09 NOTE — Addendum Note (Signed)
Addended by: Pincus Sanes on: 05/09/2023 10:32 AM   Modules accepted: Orders

## 2023-05-09 NOTE — Addendum Note (Signed)
Addended by: Pincus Sanes on: 05/09/2023 10:31 AM   Modules accepted: Orders

## 2023-05-11 DIAGNOSIS — Z86018 Personal history of other benign neoplasm: Secondary | ICD-10-CM | POA: Diagnosis not present

## 2023-05-11 DIAGNOSIS — D225 Melanocytic nevi of trunk: Secondary | ICD-10-CM | POA: Diagnosis not present

## 2023-05-11 DIAGNOSIS — D239 Other benign neoplasm of skin, unspecified: Secondary | ICD-10-CM | POA: Diagnosis not present

## 2023-05-11 DIAGNOSIS — L821 Other seborrheic keratosis: Secondary | ICD-10-CM | POA: Diagnosis not present

## 2023-05-12 DIAGNOSIS — K429 Umbilical hernia without obstruction or gangrene: Secondary | ICD-10-CM | POA: Diagnosis not present

## 2023-06-17 DIAGNOSIS — K429 Umbilical hernia without obstruction or gangrene: Secondary | ICD-10-CM | POA: Diagnosis not present

## 2023-08-24 ENCOUNTER — Encounter: Payer: Self-pay | Admitting: Internal Medicine

## 2023-08-26 ENCOUNTER — Other Ambulatory Visit (INDEPENDENT_AMBULATORY_CARE_PROVIDER_SITE_OTHER)

## 2023-08-26 DIAGNOSIS — Z125 Encounter for screening for malignant neoplasm of prostate: Secondary | ICD-10-CM

## 2023-08-26 DIAGNOSIS — E038 Other specified hypothyroidism: Secondary | ICD-10-CM | POA: Diagnosis not present

## 2023-08-26 LAB — TSH: TSH: 4.06 u[IU]/mL (ref 0.35–5.50)

## 2023-08-26 LAB — PSA, MEDICARE: PSA: 2.5 ng/mL (ref 0.10–4.00)

## 2023-08-28 ENCOUNTER — Encounter: Payer: Self-pay | Admitting: Internal Medicine

## 2023-10-14 DIAGNOSIS — K080 Exfoliation of teeth due to systemic causes: Secondary | ICD-10-CM | POA: Diagnosis not present

## 2023-11-24 DIAGNOSIS — K080 Exfoliation of teeth due to systemic causes: Secondary | ICD-10-CM | POA: Diagnosis not present

## 2023-11-26 ENCOUNTER — Other Ambulatory Visit: Payer: Self-pay | Admitting: Chiropractic Medicine

## 2023-11-26 ENCOUNTER — Ambulatory Visit
Admission: RE | Admit: 2023-11-26 | Discharge: 2023-11-26 | Disposition: A | Discharge status: Home / Self Care | Source: Ambulatory Visit | Attending: Chiropractic Medicine | Admitting: Chiropractic Medicine

## 2023-11-26 DIAGNOSIS — M25551 Pain in right hip: Secondary | ICD-10-CM

## 2023-11-26 DIAGNOSIS — M160 Bilateral primary osteoarthritis of hip: Secondary | ICD-10-CM | POA: Diagnosis not present

## 2023-12-02 ENCOUNTER — Other Ambulatory Visit: Payer: Self-pay

## 2023-12-02 ENCOUNTER — Ambulatory Visit: Admitting: Sports Medicine

## 2023-12-02 VITALS — BP 114/82 | Ht 73.0 in | Wt 185.0 lb

## 2023-12-02 DIAGNOSIS — M25551 Pain in right hip: Secondary | ICD-10-CM | POA: Diagnosis not present

## 2023-12-02 DIAGNOSIS — M1611 Unilateral primary osteoarthritis, right hip: Secondary | ICD-10-CM

## 2023-12-02 MED ORDER — METHYLPREDNISOLONE ACETATE 40 MG/ML IJ SUSP
40.0000 mg | Freq: Once | INTRAMUSCULAR | Status: AC
  Administered 2023-12-02: 40 mg via INTRA_ARTICULAR

## 2023-12-02 NOTE — Progress Notes (Signed)
 PCP: Geofm Glade PARAS, MD  SUBJECTIVE:   HPI:  Patient is a 68 y.o. male here with chief complaint of right hip pain.  He has been dealing with pain in the low back/right hip for the past 18 months. He follows with [name], DC and they have been working on his back which has improved but his hip pain persists. He denies pain or issues with his left hip. No radiculopathy. He remains active playing pickleball and working out in [employer] Doesn't have much pain with activity but hip flexion does reproduce his pain. He was referred over by Dr. Ellen for consideration of right hip injection.  Pertinent ROS were reviewed with the patient and found to be negative unless otherwise specified above in HPI.   PERTINENT  PMH / PSH / FH / SH:  Past Medical, Surgical, Social, and Family History Reviewed & Updated in the EMR.  Pertinent findings include:  Non-contributory  No Known Allergies  OBJECTIVE:  BP 114/82   Ht 6' 1 (1.854 m)   Wt 185 lb (83.9 kg)   BMI 24.41 kg/m   PHYSICAL EXAM:  GEN: Alert and Oriented, NAD, comfortable in exam room RESP: Unlabored respirations, symmetric chest rise PSY: normal mood, congruent affect   RIGHT HIP MSK EXAM: No deformity or swelling. ROM limited in flexion and internal rotation. Full ROM of external rotation. Hamstrings with normal flexibility.  Hip strength 5/5, pain with Stinchfield on the right. + TTP anterior hip. No TTP over greater troch, glutes, SI joint or lumbar spine.  - FABER, + FADIR Neurovascularly intact distally.   Independent review of recent X-rays of bilateral hips shows mild-moderate right hip OA with moderate pincer deformity suggestive of FAI. Left hip with mild OA and CAM lesion suggestive of FAI.   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 Has not improved with chiropractic management and strengthening. Recommended cortisone  injection and he was amenable to this, performed as below. Tolerated well and will follow-up with us  as needed - has follow-up plans with Dr. Ellen in 2 weeks. Discussed injection can be repeated as soon as 3 months.  Procedure: U/S Guided Right Hip Joint Injection After discussion of the risks, benefits, and alternatives to injection the patient provided verbal and written consent to proceed.  A preprocedural timeout was conducted to verify correct patient, procedure, and site.  Prior to the procedure the anterior right hip was examined with a curvilinear probe with visualization of the femoral neck, femoral head, femoral acetabular joint, and overlying joint capsule.  Color Doppler flow utilized to visualize vasculature and optimal needle track.  Following this the skin was marked and prepped with chlorhexidine scrub.  The area was again reexamined using the same transducer and sterile gel in the no touch technique.  Local anesthesia was obtained using a 1.5 inch 27 gauge needle with 3 mL 1% lidocaine under ultrasound guidance.  Then under ultrasound guidance a 3.5 inch 22-gauge needle was advanced into the hip joint using in-plane approach.  A mixture of 4 mL 1% lidocaine and 1 mL of Depo-Medrol 40 mg/mL was injected with good visualization of injectate within the capsule. Patient tolerated procedure well without immediate complications. The patient was counseled as to the expected post-injection course, including the possibility of worsening of pain with steroid flare. Instructed as to concerning symptoms and advised to contact the office if these should arise.    Prentice Niece, MD PGY-4,  Sports Medicine Fellow [facility]

## 2023-12-02 NOTE — Patient Instructions (Signed)

## 2024-01-11 ENCOUNTER — Ambulatory Visit
Admission: RE | Admit: 2024-01-11 | Discharge: 2024-01-11 | Disposition: A | Discharge status: Home / Self Care | Source: Ambulatory Visit | Attending: Chiropractic Medicine | Admitting: Chiropractic Medicine

## 2024-01-11 ENCOUNTER — Other Ambulatory Visit: Payer: Self-pay | Admitting: Chiropractic Medicine

## 2024-01-11 DIAGNOSIS — M47816 Spondylosis without myelopathy or radiculopathy, lumbar region: Secondary | ICD-10-CM

## 2024-01-11 DIAGNOSIS — M47817 Spondylosis without myelopathy or radiculopathy, lumbosacral region: Secondary | ICD-10-CM | POA: Diagnosis not present

## 2024-01-11 DIAGNOSIS — M5137 Other intervertebral disc degeneration, lumbosacral region with discogenic back pain only: Secondary | ICD-10-CM | POA: Diagnosis not present

## 2024-01-18 ENCOUNTER — Encounter: Payer: Self-pay | Admitting: Internal Medicine

## 2024-01-24 NOTE — Progress Notes (Deleted)
     Timothy [name] T. [name], MD, CAQ Sports Medicine [facility] [address] [place], [address]  Phone: [phone]  FAX: [phone]  Timothy [name] - 68 y.o. male  MRN [id number]  Date of Birth: 1955/10/19  Date: 01/27/2024  PCP: Timothy Glade PARAS, MD  Referral: Timothy Glade PARAS, MD  No chief complaint on file.  Subjective:   Timothy [name] is a 68 y.o. very pleasant male patient with There is no height or weight on file to calculate BMI. who presents with the following:  Discussed the use of AI scribe software for clinical note transcription with the patient, who gave verbal consent to proceed.  Patient presents with ongoing left-sided low back pain.  She does have a history of radiculopathy.  He was seen a couple of months ago by one of the fellows at the [facility] for ongoing hip pain.  He has been working with Timothy [name], DC, intermittently for low back pain.    History of Present Illness      Review of Systems is noted in the HPI, as appropriate  Objective:   There were no vitals taken for this visit.  GEN: No acute distress; alert,appropriate. PULM: Breathing comfortably in no respiratory distress PSYCH: Normally interactive.   Physical Exam   Laboratory and Imaging Data:  Results   Assessment and Plan:   No diagnosis found.  Assessment and Plan Assessment & Plan      Medication Management during today's office visit: No orders of the defined types were placed in this encounter.  There are no discontinued medications.  Orders placed today for conditions managed today: No orders of the defined types were placed in this encounter.   Disposition: No follow-ups on file.  [employer] One speech-to-text software was used for transcription in this dictation.  Possible transcriptional errors can occur using Animal nutritionist.   Signed,  Timothy [name]. Timothy Tadros, MD   Outpatient Encounter Medications as of  01/27/2024  Medication Sig   cetirizine (ZYRTEC) 10 MG tablet Take 10 mg by mouth daily.   clobetasol cream (TEMOVATE) 0.05 % Apply 1 application. topically 2 (two) times daily.   ibuprofen (ADVIL) 600 MG tablet Take 600 mg by mouth every 6 (six) hours as needed.   levothyroxine  (SYNTHROID ) 150 MCG tablet TAKE 1.5 TABLETS BY MOUTH 3 DAYS A WEEK AND 1 TABLET BY MOUTH 4 DAYS A WEEK   loratadine (CLARITIN) 10 MG tablet Take 10 mg by mouth daily.   metroNIDAZOLE (METROGEL) 0.75 % gel Apply topically 2 (two) times daily.   tadalafil  (CIALIS ) 20 MG tablet Take 1 tablet (20 mg total) by mouth every other day as needed for erectile dysfunction.   triamcinolone  ointment (KENALOG ) 0.1 % Apply topically.   No facility-administered encounter medications on file as of 01/27/2024.

## 2024-01-26 ENCOUNTER — Encounter: Payer: Self-pay | Admitting: Internal Medicine

## 2024-01-26 NOTE — Progress Notes (Unsigned)
    Subjective:    Patient ID: Timothy [name], male    DOB: May 08, 1956, 68 y.o.   MRN: [id number]      HPI Timothy [name] is here for No chief complaint on file.    Back pain, chronic - seeing Dr Orlando who is a Land at [employer].  He has been going there for 9 months and received active release treatment therapy.  Xray done  - mild multilevel degenerative changes in lumbar spine.      DG Lumbar Spine Complete CLINICAL DATA:  Eighteen month history of left lower back pain. No known injury.  EXAM: LUMBAR SPINE - COMPLETE 5 VIEW  COMPARISON:  None Available.  FINDINGS: There is no evidence of lumbar spine fracture. Alignment is normal. Mild multilevel intervertebral disc space narrowing with anterior disc osteophyte formation spanning L1-S1 with facet arthropathy most pronounced at L4-S1.  IMPRESSION: Mild multilevel degenerative changes of the lumbar spine.  Electronically Signed   By: Limin  Xu M.D.   On: 01/11/2024 14:26    Medications and allergies reviewed with patient and updated if appropriate.  Current Outpatient Medications on File Prior to Visit  Medication Sig Dispense Refill   cetirizine (ZYRTEC) 10 MG tablet Take 10 mg by mouth daily.     clobetasol cream (TEMOVATE) 0.05 % Apply 1 application. topically 2 (two) times daily.     ibuprofen (ADVIL) 600 MG tablet Take 600 mg by mouth every 6 (six) hours as needed.     levothyroxine  (SYNTHROID ) 150 MCG tablet TAKE 1.5 TABLETS BY MOUTH 3 DAYS A WEEK AND 1 TABLET BY MOUTH 4 DAYS A WEEK     loratadine (CLARITIN) 10 MG tablet Take 10 mg by mouth daily.     metroNIDAZOLE (METROGEL) 0.75 % gel Apply topically 2 (two) times daily.     tadalafil  (CIALIS ) 20 MG tablet Take 1 tablet (20 mg total) by mouth every other day as needed for erectile dysfunction. 5 tablet 11   triamcinolone  ointment (KENALOG ) 0.1 % Apply topically.     No current facility-administered medications on file prior  to visit.    Review of Systems     Objective:  There were no vitals filed for this visit. BP Readings from Last 3 Encounters:  12/02/23 114/82  05/05/23 116/70  04/10/22 114/72   Wt Readings from Last 3 Encounters:  12/02/23 185 lb (83.9 kg)  05/05/23 194 lb (88 kg)  04/10/22 190 lb (86.2 kg)   There is no height or weight on file to calculate BMI.    Physical Exam         Assessment & Plan:    See Problem List for Assessment and Plan of chronic medical problems.

## 2024-01-26 NOTE — Patient Instructions (Incomplete)
     Medications changes include :   None    A referral was ordered and someone will call you to schedule an appointment.     No follow-ups on file.

## 2024-01-27 ENCOUNTER — Ambulatory Visit: Admitting: Internal Medicine

## 2024-01-27 ENCOUNTER — Ambulatory Visit: Admitting: Family Medicine

## 2024-01-27 VITALS — BP 120/80 | HR 49 | Temp 98.2°F | Ht 73.0 in | Wt 188.0 lb

## 2024-01-27 DIAGNOSIS — G8929 Other chronic pain: Secondary | ICD-10-CM | POA: Diagnosis not present

## 2024-01-27 DIAGNOSIS — M5416 Radiculopathy, lumbar region: Secondary | ICD-10-CM

## 2024-01-27 DIAGNOSIS — M51371 Other intervertebral disc degeneration, lumbosacral region with lower extremity pain only: Secondary | ICD-10-CM

## 2024-01-27 DIAGNOSIS — M545 Low back pain, unspecified: Secondary | ICD-10-CM

## 2024-01-27 NOTE — Assessment & Plan Note (Signed)
 Chronic Pain started over a year ago Has been doing chiropractic active release therapy for 9 months without improvement X-rays of hips, pelvis and lumbar spine done Pain feels more like a cramping sensation and is typically more with transitioning into different positions or after resting Pain sounds more muscular Interestingly pain resolved temporarily after steroid injection into right hip ?  Compensation of that muscle for a different reason versus back being the primary area of pain I do not think an MRI would definitely give us  an answer Recommended that he see sports medicine for further evaluation to get their opinion before doing more advanced imaging Referral to sports medicine

## 2024-02-01 ENCOUNTER — Telehealth: Payer: Self-pay

## 2024-02-01 ENCOUNTER — Ambulatory Visit (INDEPENDENT_AMBULATORY_CARE_PROVIDER_SITE_OTHER): Admitting: Family Medicine

## 2024-02-01 ENCOUNTER — Encounter: Payer: Self-pay | Admitting: Family Medicine

## 2024-02-01 VITALS — BP 124/86 | Ht 73.0 in | Wt 184.0 lb

## 2024-02-01 DIAGNOSIS — M5416 Radiculopathy, lumbar region: Secondary | ICD-10-CM

## 2024-02-01 NOTE — Progress Notes (Signed)
 PCP: Geofm Glade PARAS, MD  Subjective:   HPI: Patient is a 68 y.o. male here for evaluation of left-sided lower back pain, last seen in clinic on 12/02/2023.  Patient reports that he has been experiencing pain in his left lower back for over a year and a half. He describes the pain as a cramping sensation and believes he can feel a knot in his lower back. He says that his symptoms are worse after prolonged periods of sitting. He has been able to play pickleball and lift weights without any issues. However, he reports that he is apprehensive when taking steps up onto elevated surfaces. He denies taking any medications to help with the pain and tried CBD oil which was not helpful. He has been working with Dr. Otelia doing chiropractic active release and home physical therapy for several months without relief of symptoms. He had a steroid injection in his right hip that relieved his left sided back pain for roughly one week but then the pain returned. He denies any history of trauma or lower back injury to the area. He underwent x-rays of the lumbar spine which showed mild degenerative chances. He has a history of sciatica roughly 10 years ago but reports that it was on his right side.   No history of fever, chills, cancer/malignancy. Patient denies lower extremity weakness or instances of bowel/bladder incontinence.   Past Medical History:  Diagnosis Date   Allergy    seasonal   ED (erectile dysfunction)    Bertrum syndrome    Hypothyroidism    PONV (postoperative nausea and vomiting)    ~ 30 yrs ago with appendectomy    Current Outpatient Medications on File Prior to Visit  Medication Sig Dispense Refill   cetirizine (ZYRTEC) 10 MG tablet Take 10 mg by mouth daily.     clobetasol cream (TEMOVATE) 0.05 % Apply 1 application. topically 2 (two) times daily.     ibuprofen (ADVIL) 600 MG tablet Take 600 mg by mouth every 6 (six) hours as needed.     levothyroxine  (SYNTHROID ) 150 MCG tablet TAKE  1.5 TABLETS BY MOUTH 3 DAYS A WEEK AND 1 TABLET BY MOUTH 4 DAYS A WEEK     loratadine (CLARITIN) 10 MG tablet Take 10 mg by mouth daily.     metroNIDAZOLE (METROGEL) 0.75 % gel Apply topically 2 (two) times daily.     tadalafil  (CIALIS ) 20 MG tablet Take 1 tablet (20 mg total) by mouth every other day as needed for erectile dysfunction. 5 tablet 11   triamcinolone  ointment (KENALOG ) 0.1 % Apply topically.     No current facility-administered medications on file prior to visit.    Past Surgical History:  Procedure Laterality Date   APPENDECTOMY     BASAL CELL CARCINOMA EXCISION  08/08/2014   Benign Nevi      Removed x 3/ Dr. Lorrayne Seashore    CARPAL TUNNEL RELEASE  5/12/20007   Bilateral ; Dr Murrell   COLONOSCOPY  2005 & 2011   Normal; [facility]   MENISCUS REPAIR Bilateral    POLYPECTOMY     TA x 2 2017   SHOULDER SURGERY Bilateral    chromium decompression   TONSILLECTOMY      No Known Allergies  There were no vitals taken for this visit.      No data to display              No data to display  Objective:  Physical Exam:  Gen: NAD, comfortable in exam room  MSK: Lower back and lower extremities  Inspection: No bony or soft tissue abnormalities appreciated. Left side of the lower back appears symmetrical in comparison with right side of the lower back.  Palpation: There is tenderness to palpation on the left side of the lower back around L1-L2. No tenderness to palpation of the spine, paraspinal muscles, or SI joint.  ROM: FROM with spine flexion/extension, lateral bending, and twisting. Patient endorses mild discomfort with leftward twisting. FROM with hip flexion/extension and internal/external rotation. Strength: 5/5 with knee extension/flexion, dorsiflexion/plantar flexion, and hip flexion.  Neuro/Vasc: Neurovascularly intact distally.  Special Tests: Negative Straight leg raise, negative log roll, negative FABIR/FADIR  01/11/2024 X-ray Lumbar  Spine: There is no evidence of lumbar spine fracture. Alignment is normal. Mild multilevel intervertebral disc space narrowing with anterior disc osteophyte formation spanning L1-S1 with facet arthropathy most pronounced at L4-S1.    Assessment & Plan:  1. Chronic lower back pain - Patient's left-sided lower back pain appears to be most consistent with a muscular injury versus nerve impingement. However, we would expect his symptoms to improve if this was purely muscular given that he has tried several months of conservative management with chiropractic active release and home physical therapy. Additionally, suspect the steroid injection in the right hip might have led to systemic relief nerve-related symptoms for a week. Therefore, discussed with patient that we will move forward with obtaining an MRI of the lumbar spine to evaluate for a pinched nerve. Patient is in agreement with this plan. In the meantime, discussed with patient that he can take Aleve twice daily with food as needed for pain and inflammation. Additionally, advised patient to continue his home physical therapy exercises. Will schedule a virtual visit in the future to discuss MRI results and may consider a short course of oral steroids at the time, pending the results.   - MRI lumbar spine  - Aleve twice daily with food as needed for pain and inflammation  - Continue home PT  - Follow-up visit to discuss MRI results  - Consider course of oral Prednisone  in the future pending MRI results  Signe Ravel, MS4 [facility]

## 2024-02-01 NOTE — Telephone Encounter (Signed)
 [phone] Called about patient needing PA for his MRI that is scheduled for tomorrow 02/02/24, please advise, thanks.

## 2024-02-01 NOTE — Patient Instructions (Signed)
 While this still may be muscular it's possible you have an irritated upper lumbar nerve root that's causing this pain in the left side of your low back. Given the lack of improvement with months of conservative treatment, exercises we will go ahead with an MRI of your lumbar spine. Make an appointment (can be virtual) for a no charge visit when the results come back to go over the results. Aleve 2 tabs twice a day with food for pain and inflammation. Continue the home exercises Venetia taught you.

## 2024-02-02 ENCOUNTER — Ambulatory Visit
Admission: RE | Admit: 2024-02-02 | Discharge: 2024-02-02 | Disposition: A | Discharge status: Home / Self Care | Source: Ambulatory Visit | Attending: Family Medicine | Admitting: Family Medicine

## 2024-02-02 DIAGNOSIS — M5416 Radiculopathy, lumbar region: Secondary | ICD-10-CM

## 2024-02-02 DIAGNOSIS — M48061 Spinal stenosis, lumbar region without neurogenic claudication: Secondary | ICD-10-CM | POA: Diagnosis not present

## 2024-02-02 DIAGNOSIS — M4726 Other spondylosis with radiculopathy, lumbar region: Secondary | ICD-10-CM | POA: Diagnosis not present

## 2024-02-02 DIAGNOSIS — M5116 Intervertebral disc disorders with radiculopathy, lumbar region: Secondary | ICD-10-CM | POA: Diagnosis not present

## 2024-02-10 ENCOUNTER — Encounter: Payer: Self-pay | Admitting: Family Medicine

## 2024-02-10 ENCOUNTER — Ambulatory Visit (INDEPENDENT_AMBULATORY_CARE_PROVIDER_SITE_OTHER): Admitting: Family Medicine

## 2024-02-10 VITALS — BP 120/82 | Ht 73.0 in | Wt 184.0 lb

## 2024-02-10 DIAGNOSIS — M48061 Spinal stenosis, lumbar region without neurogenic claudication: Secondary | ICD-10-CM

## 2024-02-10 NOTE — Progress Notes (Signed)
 PCP: Geofm Glade PARAS, MD  Subjective:   HPI: Patient is a 68 y.o. male here for MRI follow-up.  Seen by Dr. Cleatrice 08/25 for chronic lumbar low back pain Exam at that time consistent with muscular vs lumbar nerve impingment MRI showed dextroconvex lumbar scoliosis with multilevel disc and endplate degeneration, moderate spinal stenosis His left sided low back pain has improved (muscular pain) with aleve  Past Medical History:  Diagnosis Date   Allergy    seasonal   ED (erectile dysfunction)    Bertrum syndrome    Hypothyroidism    PONV (postoperative nausea and vomiting)    ~ 30 yrs ago with appendectomy    Current Outpatient Medications on File Prior to Visit  Medication Sig Dispense Refill   cetirizine (ZYRTEC) 10 MG tablet Take 10 mg by mouth daily.     clobetasol cream (TEMOVATE) 0.05 % Apply 1 application. topically 2 (two) times daily.     ibuprofen (ADVIL) 600 MG tablet Take 600 mg by mouth every 6 (six) hours as needed.     levothyroxine  (SYNTHROID ) 150 MCG tablet TAKE 1.5 TABLETS BY MOUTH 3 DAYS A WEEK AND 1 TABLET BY MOUTH 4 DAYS A WEEK     loratadine (CLARITIN) 10 MG tablet Take 10 mg by mouth daily.     metroNIDAZOLE (METROGEL) 0.75 % gel Apply topically 2 (two) times daily.     tadalafil  (CIALIS ) 20 MG tablet Take 1 tablet (20 mg total) by mouth every other day as needed for erectile dysfunction. 5 tablet 11   triamcinolone  ointment (KENALOG ) 0.1 % Apply topically.     No current facility-administered medications on file prior to visit.    Past Surgical History:  Procedure Laterality Date   APPENDECTOMY     BASAL CELL CARCINOMA EXCISION  08/08/2014   Benign Nevi      Removed x 3/ Dr. Lorrayne Seashore    CARPAL TUNNEL RELEASE  5/12/20007   Bilateral ; Dr Murrell   COLONOSCOPY  2005 & 2011   Normal; [facility]   MENISCUS REPAIR Bilateral    POLYPECTOMY     TA x 2 2017   SHOULDER SURGERY Bilateral    chromium decompression   TONSILLECTOMY      No Known  Allergies  There were no vitals taken for this visit.      No data to display              No data to display              Objective:  Physical Exam:  Gen: NAD, comfortable in exam room   Assessment & Plan:  1. Low back pain - MRI showed moderate spinal stenosis at L3-4 with moderate left foraminal stenosis as most significant finding.  Should do well with conservative treatment.  No radicular symptoms currently.  Will refer to physical therapy for 1-3 visits for flexion-based home exercise program.  Advised to avoid extension as much as possible.  Aleve if needed.  Follow up in 1 month.

## 2024-03-30 ENCOUNTER — Other Ambulatory Visit: Payer: Self-pay

## 2024-03-30 ENCOUNTER — Ambulatory Visit (HOSPITAL_BASED_OUTPATIENT_CLINIC_OR_DEPARTMENT_OTHER): Attending: Family Medicine | Admitting: Physical Therapy

## 2024-03-30 DIAGNOSIS — H43811 Vitreous degeneration, right eye: Secondary | ICD-10-CM | POA: Diagnosis not present

## 2024-03-30 DIAGNOSIS — M5459 Other low back pain: Secondary | ICD-10-CM | POA: Diagnosis not present

## 2024-03-30 DIAGNOSIS — M48061 Spinal stenosis, lumbar region without neurogenic claudication: Secondary | ICD-10-CM | POA: Insufficient documentation

## 2024-03-30 NOTE — Therapy (Signed)
 OUTPATIENT PHYSICAL THERAPY THORACOLUMBAR EVALUATION   Patient Name: Timothy [name] MRN: [id number] DOB:15-May-1956, 68 y.o., male Today's Date: 03/31/2024  END OF SESSION:  PT End of Session - 03/30/24 1009     Visit Number 1    Number of Visits 5    Date for Recertification  05/11/24    Authorization Type [employer] MCR    PT Start Time [phone]    PT Stop Time 1004    PT Time Calculation (min) 72 min    Activity Tolerance Patient tolerated treatment well    Behavior During Therapy [facility] for tasks assessed/performed          Past Medical History:  Diagnosis Date   Allergy    seasonal   ED (erectile dysfunction)    Bertrum syndrome    Hypothyroidism    PONV (postoperative nausea and vomiting)    ~ 30 yrs ago with appendectomy   Past Surgical History:  Procedure Laterality Date   APPENDECTOMY     BASAL CELL CARCINOMA EXCISION  08/08/2014   Benign Nevi      Removed x 3/ Dr. Lorrayne Seashore    CARPAL TUNNEL RELEASE  5/12/20007   Bilateral ; Dr Murrell   COLONOSCOPY  2005 & 2011   Normal; [facility]   MENISCUS REPAIR Bilateral    POLYPECTOMY     TA x 2 2017   SHOULDER SURGERY Bilateral    chromium decompression   TONSILLECTOMY     Patient Active Problem List   Diagnosis Date Noted   Chronic left-sided low back pain without sciatica 01/27/2024   Hyperglycemia 02/13/2021   Family history of diabetes mellitus in daughter 04/10/2020   Umbilical hernia without obstruction or gangrene 01/04/2018   S/P arthroscopy of left shoulder 07/21/2017   Sciatica of left side 10/11/2015   History of basal cell carcinoma (BCC) 09/08/2014   ED (erectile dysfunction) 05/28/2012   Family history of ischemic heart disease 02/20/2012   ELEVATED PROSTATE SPECIFIC ANTIGEN 10/11/2008   Hypothyroidism 08/30/2007   BPH (benign prostatic hyperplasia) 08/30/2007    PCP: [name], MD  REFERRING PROVIDER: [name], MD  REFERRING DIAG: [id number] (ICD-10-CM) - Spinal stenosis of lumbar  region, unspecified whether neurogenic claudication present  Rationale for Evaluation and Treatment: Rehabilitation  THERAPY DIAG:  Other low back pain  ONSET DATE: Spring 2024  SUBJECTIVE:                                                                                                                                                                                           SUBJECTIVE STATEMENT: Pain began in Spring 2024  with no specific MOI.  Pt received chiropractic care with active release techniques for approx 1 year with no improvement.  He also performed core and LE strengthening and stretching exercises from the chiropractor.  He reports the exercises have not helped, but he doesn't have any pain with exercises.  Pt had x-rays in bilat hips and then had a MRI in lumbar.  Pt received a cortisone injection in R hip and his back released for one week.  His pain returned in a week.  He took Aleve which also released his back.  Pt saw PCP on 8/20 and was referred to sports med MD.  Pt saw Dr. Cleatrice and he ordered a MRI.  MD note indicated:  MRI showed moderate spinal stenosis at L3-4 with moderate left foraminal stenosis as most significant finding. Should do well with conservative treatment. No radicular symptoms currently. Will refer to physical therapy for 1-3 visits for flexion-based home exercise program. Advised to avoid extension as much as possible.  PT order indicated Evaluate and treat for lumbar spinal stenosis.  1-3 visits then d/c to a HEP.  Pt feels like his back cramps up and doesn't want to release.  It's a constant feeling which primarily affects his L side.  Pt does feel some tightness in R side of lumbar though much worse on L.  His sx's are worse 1st thing in AM, standing up after sitting for awhile, rolling over in bed, and transitioning from lying to sitting.  He feels the cramp in the initial steps which improves with further steps.  Pt feels good sitting with correct posture  and has pain if he is slouching.  He plays pickelball and lifts weights and reports no increased sx's or adverse effects with those activities.  He tried CBD oil which was not helpful.  Pt reports he does get relief from figure 4 stretch.  He gets short term relief, but the pain returns.           Pt had sciatica roughly 10 years ago which affected his R LE and primarily felt like a cramping in his R calf.  He experienced those sx's in early October which Aleve helped.      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   Worst is when standing up after sitting awhile. Location:  L sided lumbar  PRECAUTIONS: Other: per dx and MRI findings   WEIGHT BEARING RESTRICTIONS: No  FALLS:  Has patient fallen in last 6 months? No  LIVING ENVIRONMENT: Lives with: lives with their spouse Lives in: 2 story home, but primarily stays on 1st floor Stairs: yes   OCCUPATION: retired  PLOF: Independent  PATIENT GOALS: to have no pain, learn ways to strengthen to not get worse   OBJECTIVE:  Note: Objective measures were completed at Evaluation unless otherwise noted.  DIAGNOSTIC FINDINGS:  Lumbar MRI: Disc levels:   T11-T12: Mild disc desiccation, disc space loss. Minimal disc bulging and no stenosis is evident.   T12-L1:  Negative.   L1-L2: Disc space loss and circumferential disc bulge. No spinal stenosis. Mild lateral recess and foraminal stenosis bilaterally.   L2-L3: Similar disc space loss. Circumferential disc osteophyte complex asymmetric to the left, left far lateral  component. Mild facet and ligament flavum hypertrophy. Mild spinal stenosis and moderate left lateral recess stenosis (left L3 nerve level series 105, image 19). Moderate left  L2 neural foraminal stenosis. Mild if any right foraminal stenosis.   L3-L4: Disc desiccation and more symmetric circumferential disc bulging. Up to moderate facet and ligament flavum hypertrophy greater on the right. Moderate spinal stenosis (series 105, image 26). Fairly symmetric lateral recess involvement. Moderate left and mild right L3 neural foraminal stenosis (series 104, image 12 on the left).   L4-L5: Disc desiccation circumferential disc bulge. Small posterior annular fissures of the disc, including at the left lateral recess on series 104, image 12 (descending left L5 nerve level). Mild to moderate facet and ligament flavum hypertrophy. No spinal stenosis. No convincing lateral recess stenosis. Mild L4 neural foraminal stenosis greater on the right.   L5-S1: Mild retrolisthesis. Disc space loss and circumferential disc osteophyte complex with a right paracentral posterior component (series 105, image 40). Less posterior element hypertrophy here. No spinal stenosis. Mild to moderate right lateral recess stenosis (right S1 nerve level series 104, image 7). Mild to moderate right L5 neural foraminal stenosis (series 104, image 5).   IMPRESSION: 1. Dextroconvex lumbar scoliosis with multilevel disc and endplate degeneration. Moderate degenerative endplate marrow edema radiating to the left at L2-L3 (see #3). No other acute osseous abnormality. 2. Moderate multifactorial spinal stenosis at L3-L4, with moderate left neural foraminal stenosis. 3. Mild spinal stenosis at L2-L3 with moderate left lateral recess and left foraminal stenosis. 4. Mild to moderate right lateral recess and right foraminal stenosis at L5-S1.  Lumbar x ray: FINDINGS: There is no evidence of lumbar spine fracture. Alignment is normal. Mild multilevel intervertebral disc space narrowing with anterior disc osteophyte formation spanning L1-S1 with facet arthropathy most pronounced at L4-S1.    IMPRESSION: Mild multilevel degenerative changes of the lumbar spine.  Hip x rays: IMPRESSION: 1. Mild-to-moderate osteoarthritis of the right hip with diminished cut back of the right femoral head and neck junction and pincer morphology of the right acetabulum, which can predispose to mixed type femoroacetabular impingement. 2. Mild osteoarthritis of the left hip with diminished cut back of the left femoral head neck junction, which can predispose to CAM type femoroacetabular impingement. 3. No acute osseous abnormality.  PATIENT SURVEYS:  Modified Oswestry Disability Index:  6%  COGNITION: Overall cognitive status: Within functional limits for tasks assessed     MUSCLE LENGTH: Hamstrings:  minimal tightness in bilat HS   PALPATION: Soft tissue tightness in bilat lumbar paraspinals.  Pt has tenderness in R sided lumbar paraspinals.  LUMBAR ROM:   AROM eval  Flexion WNL  Extension WFL without pain  Right lateral flexion WFL  Left lateral flexion WFL  Right rotation WNL  Left rotation WNL   (Blank rows = not tested)  LOWER EXTREMITY ROM:     Active  Right eval Left eval  Hip flexion    Hip extension    Hip abduction [facility]  Hip adduction    Hip internal rotation    Hip external rotation    Knee flexion    Knee extension [facility]  Ankle dorsiflexion    Ankle plantarflexion    Ankle inversion    Ankle eversion     (Blank rows = not tested)  LOWER EXTREMITY MMT:    MMT Right eval Left eval  Hip flexion 5/5 5/5  Hip extension    Hip abduction 5/5 5/5  Hip adduction  Hip internal rotation    Hip external rotation 5/5 5/5  Knee flexion 5/5 5/5  Knee extension 5/5 5/5  Ankle dorsiflexion 5/5 5/5  Ankle plantarflexion WFL seated WFL seated  Ankle inversion    Ankle eversion     (Blank rows = not tested)  LUMBAR SPECIAL TESTS:  SLR test:  negative bilat  Supine SL K to C:  Feels slightly better, but not significant.  Pt had no pain before doing  exercise.     GAIT:  Assistive device utilized: None Level of assistance: Complete Independence Comments: good heel to toe gait, no limping.  Limited reciprocal arm swing.   TREATMENT:                                                                                                                                PT educated pt in correct performance and palpation of TrA contraction.  Pt performed supine TrA contractions with and without 5 sec hold.  PT demonstrated and instructed pt in using a tennis ball in a pillowcase on the wall for his lumbar paraspinals to improve soft tissue tightness and mobility.  He performed in the clinic with PT instruction.  Pt received a HEP handout and was educated in correct form and appropriate frequency.    PATIENT EDUCATION:  Education details:  Dx, prognosis, HEP, posture, POC, rationale of interventions, appropriate response to exercises/activities, what to avoid, and what to expect next treatment.  PT used a spinal model to educate pt concerning relevant anatomy and MRI findings.  PT answered pt's questions including appropriate exercises and activities and which ones to avoid.  PT instructed pt in avoiding extension exercises/activities.   Person educated: Patient Education method: Explanation, Demonstration, Tactile cues, Verbal cues, and Handouts Education comprehension: verbalized understanding, returned demonstration, verbal cues required, tactile cues required, and needs further education  HOME EXERCISE PROGRAM: Access Code: [email or web address] URL: [email or web address] Date: 03/31/2024 Prepared by: Mose Minerva  Exercises - Supine Transversus Abdominis Bracing - Hands on Stomach  - 2 x daily - 7 x weekly - 2 sets - 10 reps - 5 seconds hold - Standing Paraspinals Mobilization with Small Ball on Wall  - 7 x weekly  ASSESSMENT:  CLINICAL IMPRESSION: Patient is a 68 y.o. male with a dx of lumbar spinal stenosis.  Pt feels tightness like a  cramp primarily on his L side.  He has increased pain with standing up after sitting awhile, bed mobility, and with initial steps after sitting awhile.  Pt reports improved sx's with Aleve and correct sitting posture.  He is able to play pickelball and lifts weights without increased sx's or adverse effects.  Pt has good lumbar AROM and good strength in bilat LE's.  Pt has soft tissue tightness in bilat lumbar paraspinals.  Pt states he feels a little better after supine SL K to C though not significant.  He also had no pain before doing that exercise.  Pt should benefit from skilled PT to address impairments and overall function.      OBJECTIVE IMPAIRMENTS: increased fascial restrictions and pain.   ACTIVITY LIMITATIONS: transfers and bed mobility  PARTICIPATION LIMITATIONS:   PERSONAL FACTORS: 1-2 comorbidities: Hip OA and L knee meniscus repair are also affecting patient's functional outcome.   REHAB POTENTIAL: Good  CLINICAL DECISION MAKING: Stable/uncomplicated  EVALUATION COMPLEXITY: Low   GOALS:   SHORT TERM GOALS: Target date: 04/20/2024   Pt will report at least a 25% improvement in pain with transitional movements including transfers and bed mobility.  Baseline: Goal status: INITIAL    LONG TERM GOALS: Target date: 05/11/2024  Pt will demo improved soft tissue tightness and tenderness in lumbar paraspinals for reduced tension in lumbar and improved functional mobility.  Baseline:  Goal status: INITIAL  2.  Pt will report at least a 70% improvement in pain with standing up after sitting awhile and bed mobility.  Baseline:  Goal status: INITIAL  3.  Pt will be independent and compliant with HEP for improved pain, core strength, and functional mobility.  Baseline:  Goal status: INITIAL   PLAN:  PT FREQUENCY: 1x/week  PT DURATION: 5-6 weeks  PLANNED INTERVENTIONS: [id number]- PT Re-evaluation, 97750- Physical Performance Testing, 97110-Therapeutic exercises, 97530-  Therapeutic activity, [id number]- Neuromuscular re-education, 97535- Self Care, [id number]- Manual therapy, [id number]- Gait training, [phone]- Aquatic Therapy, [phone]- Electrical stimulation (unattended), [phone]- Electrical stimulation (manual), [id number]- Ultrasound, 79439 (1-2 muscles), [id number] (3+ muscles)- Dry Needling, Patient/Family education, Stair training, Taping, Joint mobilization, Cryotherapy, and Moist heat.  PLAN FOR NEXT SESSION: core strengthening.  STM and TPR to lumbar paraspinals.   Leigh Minerva III PT, DPT 03/31/24 11:49 PM

## 2024-03-31 ENCOUNTER — Encounter (HOSPITAL_BASED_OUTPATIENT_CLINIC_OR_DEPARTMENT_OTHER): Payer: Self-pay | Admitting: Physical Therapy

## 2024-04-05 DIAGNOSIS — H43822 Vitreomacular adhesion, left eye: Secondary | ICD-10-CM | POA: Diagnosis not present

## 2024-04-05 DIAGNOSIS — H2513 Age-related nuclear cataract, bilateral: Secondary | ICD-10-CM | POA: Diagnosis not present

## 2024-04-05 DIAGNOSIS — H43811 Vitreous degeneration, right eye: Secondary | ICD-10-CM | POA: Diagnosis not present

## 2024-04-06 ENCOUNTER — Encounter (HOSPITAL_BASED_OUTPATIENT_CLINIC_OR_DEPARTMENT_OTHER): Payer: Self-pay | Admitting: Physical Therapy

## 2024-04-06 ENCOUNTER — Ambulatory Visit (HOSPITAL_BASED_OUTPATIENT_CLINIC_OR_DEPARTMENT_OTHER): Admitting: Physical Therapy

## 2024-04-06 DIAGNOSIS — M48061 Spinal stenosis, lumbar region without neurogenic claudication: Secondary | ICD-10-CM | POA: Diagnosis not present

## 2024-04-06 DIAGNOSIS — M5459 Other low back pain: Secondary | ICD-10-CM | POA: Diagnosis not present

## 2024-04-06 NOTE — Therapy (Signed)
 OUTPATIENT PHYSICAL THERAPY THORACOLUMBAR TREATMENT   Patient Name: Timothy [name] MRN: [id number] DOB:1955-07-09, 68 y.o., male Today's Date: 04/07/2024  END OF SESSION:  PT End of Session - 04/06/24 0859     Visit Number 2    Number of Visits 5    Date for Recertification  05/11/24    Authorization Type BC[employer] MCR    PT Start Time 0854    PT Stop Time 0946    PT Time Calculation (min) 52 min    Activity Tolerance Patient tolerated treatment well    Behavior During Therapy [facility] for tasks assessed/performed          Past Medical History:  Diagnosis Date   Allergy    seasonal   ED (erectile dysfunction)    Bertrum syndrome    Hypothyroidism    PONV (postoperative nausea and vomiting)    ~ 30 yrs ago with appendectomy   Past Surgical History:  Procedure Laterality Date   APPENDECTOMY     BASAL CELL CARCINOMA EXCISION  08/08/2014   Benign Nevi      Removed x 3/ Dr. Lorrayne Seashore    CARPAL TUNNEL RELEASE  5/12/20007   Bilateral ; Dr Murrell   COLONOSCOPY  2005 & 2011   Normal; [facility]   MENISCUS REPAIR Bilateral    POLYPECTOMY     TA x 2 2017   SHOULDER SURGERY Bilateral    chromium decompression   TONSILLECTOMY     Patient Active Problem List   Diagnosis Date Noted   Chronic left-sided low back pain without sciatica 01/27/2024   Hyperglycemia 02/13/2021   Family history of diabetes mellitus in daughter 04/10/2020   Umbilical hernia without obstruction or gangrene 01/04/2018   S/P arthroscopy of left shoulder 07/21/2017   Sciatica of left side 10/11/2015   History of basal cell carcinoma (BCC) 09/08/2014   ED (erectile dysfunction) 05/28/2012   Family history of ischemic heart disease 02/20/2012   ELEVATED PROSTATE SPECIFIC ANTIGEN 10/11/2008   Hypothyroidism 08/30/2007   BPH (benign prostatic hyperplasia) 08/30/2007    PCP: [name], MD  REFERRING PROVIDER: [name], MD  REFERRING DIAG: [id number] (ICD-10-CM) - Spinal stenosis of lumbar  region, unspecified whether neurogenic claudication present  Rationale for Evaluation and Treatment: Rehabilitation  THERAPY DIAG:  Other low back pain  ONSET DATE: Spring 2024  SUBJECTIVE:                                                                                                                                                                                           SUBJECTIVE STATEMENT: Pt denies any adverse effects  after prior treatment.  Pt states he has been dealing with this for a year and a half.  He has been performing TrA contractions and supine SL K to C and using the tennis ball on the lumbar paraspinals.  Pt hasn't noticed any improvement or change in sx's.  Pt states his pain does not get worse with activity including pickleball.  Pt states he does feel a little more uncomfortable this week.    Pt had sciatica roughly 10 years ago which affected his R LE and primarily felt like a cramping in his R calf.  He experienced those sx's in early October which Aleve helped.      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   Worst is when standing up after sitting awhile. Location:  L sided lumbar  PRECAUTIONS: Other: per dx and MRI findings   WEIGHT BEARING RESTRICTIONS: No  FALLS:  Has patient fallen in last 6 months? No  LIVING ENVIRONMENT: Lives with: lives with their spouse Lives in: 2 story home, but primarily stays on 1st floor Stairs: yes   OCCUPATION: retired  PLOF: Independent  PATIENT GOALS: to have no pain, learn ways to strengthen to not get worse   OBJECTIVE:  Note: Objective measures were completed at Evaluation unless otherwise noted.  DIAGNOSTIC FINDINGS:  Lumbar MRI: Disc levels:   T11-T12: Mild  disc desiccation, disc space loss. Minimal disc bulging and no stenosis is evident.   T12-L1:  Negative.   L1-L2: Disc space loss and circumferential disc bulge. No spinal stenosis. Mild lateral recess and foraminal stenosis bilaterally.   L2-L3: Similar disc space loss. Circumferential disc osteophyte complex asymmetric to the left, left far lateral component. Mild facet and ligament flavum hypertrophy. Mild spinal stenosis and moderate left lateral recess stenosis (left L3 nerve level series 105, image 19). Moderate left L2 neural foraminal stenosis. Mild if any right foraminal stenosis.   L3-L4: Disc desiccation and more symmetric circumferential disc bulging. Up to moderate facet and ligament flavum hypertrophy greater on the right. Moderate spinal stenosis (series 105, image 26). Fairly symmetric lateral recess involvement. Moderate left and mild right L3 neural foraminal stenosis (series 104, image 12 on the left).   L4-L5: Disc desiccation circumferential disc bulge. Small posterior annular fissures of the disc, including at the left lateral recess on series 104, image 12 (descending left L5 nerve level). Mild to moderate facet and ligament flavum hypertrophy. No spinal stenosis. No convincing lateral recess stenosis. Mild L4 neural foraminal stenosis greater on the right.   L5-S1: Mild retrolisthesis. Disc space loss and circumferential disc osteophyte complex with a right paracentral posterior component (series 105, image 40). Less posterior element hypertrophy here. No spinal stenosis. Mild to moderate right lateral recess stenosis (right S1 nerve level series 104, image 7). Mild to moderate right L5 neural foraminal stenosis (series 104, image 5).   IMPRESSION: 1. Dextroconvex lumbar scoliosis with multilevel disc and endplate degeneration. Moderate degenerative endplate marrow edema radiating to the left at L2-L3 (see #3). No other acute osseous abnormality. 2.  Moderate multifactorial spinal stenosis at L3-L4, with moderate left neural foraminal stenosis. 3. Mild spinal stenosis at L2-L3 with moderate left lateral recess and left foraminal stenosis. 4. Mild to  moderate right lateral recess and right foraminal stenosis at L5-S1.  Lumbar x ray: FINDINGS: There is no evidence of lumbar spine fracture. Alignment is normal. Mild multilevel intervertebral disc space narrowing with anterior disc osteophyte formation spanning L1-S1 with facet arthropathy most pronounced at L4-S1.   IMPRESSION: Mild multilevel degenerative changes of the lumbar spine.  Hip x rays: IMPRESSION: 1. Mild-to-moderate osteoarthritis of the right hip with diminished cut back of the right femoral head and neck junction and pincer morphology of the right acetabulum, which can predispose to mixed type femoroacetabular impingement. 2. Mild osteoarthritis of the left hip with diminished cut back of the left femoral head neck junction, which can predispose to CAM type femoroacetabular impingement. 3. No acute osseous abnormality.  PATIENT SURVEYS:  Modified Oswestry Disability Index:  6%  COGNITION: Overall cognitive status: Within functional limits for tasks assessed     MUSCLE LENGTH: Hamstrings:  minimal tightness in bilat HS   PALPATION: Soft tissue tightness in bilat lumbar paraspinals.  Pt has tenderness in R sided lumbar paraspinals.  LUMBAR ROM:   AROM eval  Flexion WNL  Extension WFL without pain  Right lateral flexion WFL  Left lateral flexion WFL  Right rotation WNL  Left rotation WNL   (Blank rows = not tested)  LOWER EXTREMITY ROM:     Active  Right eval Left eval  Hip flexion    Hip extension    Hip abduction [facility]  Hip adduction    Hip internal rotation    Hip external rotation    Knee flexion    Knee extension [facility]  Ankle dorsiflexion    Ankle plantarflexion    Ankle inversion    Ankle eversion     (Blank rows = not  tested)  LOWER EXTREMITY MMT:    MMT Right eval Left eval  Hip flexion 5/5 5/5  Hip extension    Hip abduction 5/5 5/5  Hip adduction    Hip internal rotation    Hip external rotation 5/5 5/5  Knee flexion 5/5 5/5  Knee extension 5/5 5/5  Ankle dorsiflexion 5/5 5/5  Ankle plantarflexion WFL seated WFL seated  Ankle inversion    Ankle eversion     (Blank rows = not tested)  LUMBAR SPECIAL TESTS:  SLR test:  negative bilat  Supine SL K to C:  Feels slightly better, but not significant.  Pt had no pain before doing exercise.     GAIT:  Assistive device utilized: None Level of assistance: Complete Independence Comments: good heel to toe gait, no limping.  Limited reciprocal arm swing.   TREATMENT:                                                                                                                                PT answered pt's questions concerning HEP, appropriate response, correct positioning, and appropriate exercises.  PT educated pt in the importance and reasoning of neutral  spine and core strength.  See below for pt education.    Pt received STM and TPR to bilat lumbar paraspinals in prone.  PT instructed and demonstrated the usage of the theracane to pt.  Pt used the theracane in standing to lumbar paraspinals with instruction.    Reviewed current home program Supine dead bug with TrA with feet elevated  Prone plank on hands and toes x 1 min Q-ped birddogs      PATIENT EDUCATION:  Education details:  Dx, prognosis, HEP, posture, POC, rationale of interventions, appropriate response to exercises/activities, and what to avoid.  PT used a spinal model to educate pt concerning relevant anatomy and MRI findings.  PT answered pt's questions including appropriate exercises and activities and which ones to avoid.  PT instructed pt in avoiding extension exercises/activities.   Person educated: Patient Education method: Explanation, Demonstration, Tactile cues,  Verbal cues Education comprehension: verbalized understanding, returned demonstration, verbal cues required, tactile cues required, and needs further education  HOME EXERCISE PROGRAM: Access Code: [email or web address] URL: [email or web address] Date: 03/31/2024 Prepared by: Mose Minerva  Exercises - Supine Transversus Abdominis Bracing - Hands on Stomach  - 2 x daily - 7 x weekly - 2 sets - 10 reps - 5 seconds hold - Standing Paraspinals Mobilization with Small Ball on Wall  - 7 x weekly  ASSESSMENT:  CLINICAL IMPRESSION: Pt continues to perform his exercise program from the chiropractor.  PT reviewed his current program.  PT spent extensive time with pt education and answering pt's questions.  PT answered pt's questions including appropriate exercises and instructed him in which ones to stop and which ones to continue.  PT educated pt in appropriate response from exercises.  Pt has been performing supine SL K to C at home and reports no change in sx's.  He does report feeling a little more uncomfortable this week.  PT instructed pt to stop supine single leg K to C.  PT performed soft tissue work to bilat lumbar paraspinals to improve soft tissue tightness and mobility and pain and to reduce myofascial restrictions.  Pt's R sided paraspinals are tighter than his L side.  Pt reports feeling much better after STM including having improved pain from 3/10 to 0/10.  PT instructed pt in using a theracane which he used in the clinic in standing.  Pt responded well to treatment having no c/o's after treatment.  He should benefit from skilled PT to address impairments and overall function.      OBJECTIVE IMPAIRMENTS: increased fascial restrictions and pain.   ACTIVITY LIMITATIONS: transfers and bed mobility  PARTICIPATION LIMITATIONS:   PERSONAL FACTORS: 1-2 comorbidities: Hip OA and L knee meniscus repair are also affecting patient's functional outcome.   REHAB POTENTIAL: Good  CLINICAL  DECISION MAKING: Stable/uncomplicated  EVALUATION COMPLEXITY: Low   GOALS:   SHORT TERM GOALS: Target date: 04/20/2024   Pt will report at least a 25% improvement in pain with transitional movements including transfers and bed mobility.  Baseline: Goal status: INITIAL    LONG TERM GOALS: Target date: 05/11/2024  Pt will demo improved soft tissue tightness and tenderness in lumbar paraspinals for reduced tension in lumbar and improved functional mobility.  Baseline:  Goal status: INITIAL  2.  Pt will report at least a 70% improvement in pain with standing up after sitting awhile and bed mobility.  Baseline:  Goal status: INITIAL  3.  Pt will be independent and compliant with HEP for improved pain, core strength, and functional  mobility.  Baseline:  Goal status: INITIAL   PLAN:  PT FREQUENCY: 1x/week  PT DURATION: 5-6 weeks  PLANNED INTERVENTIONS: 97164- PT Re-evaluation, 97750- Physical Performance Testing, 97110-Therapeutic exercises, 97530- Therapeutic activity, [id number]- Neuromuscular re-education, 97535- Self Care, [id number]- Manual therapy, [id number]- Gait training, [phone]- Aquatic Therapy, [phone]- Electrical stimulation (unattended), [phone]- Electrical stimulation (manual), [id number]- Ultrasound, 79439 (1-2 muscles), 20561 (3+ muscles)- Dry Needling, Patient/Family education, Stair training, Taping, Joint mobilization, Cryotherapy, and Moist heat.  PLAN FOR NEXT SESSION: core strengthening.  STM and TPR to lumbar paraspinals.   Leigh Minerva III PT, DPT 04/07/24 11:05 PM

## 2024-04-08 ENCOUNTER — Other Ambulatory Visit: Payer: Self-pay | Admitting: Internal Medicine

## 2024-04-20 DIAGNOSIS — K080 Exfoliation of teeth due to systemic causes: Secondary | ICD-10-CM | POA: Diagnosis not present

## 2024-04-21 ENCOUNTER — Ambulatory Visit (HOSPITAL_BASED_OUTPATIENT_CLINIC_OR_DEPARTMENT_OTHER): Attending: Family Medicine | Admitting: Physical Therapy

## 2024-04-21 DIAGNOSIS — M5459 Other low back pain: Secondary | ICD-10-CM | POA: Insufficient documentation

## 2024-04-21 NOTE — Therapy (Signed)
 OUTPATIENT PHYSICAL THERAPY THORACOLUMBAR TREATMENT   Patient Name: Timothy [name] MRN: [id number] DOB:Sep 27, 1955, 68 y.o., male Today's Date: 04/22/2024  END OF SESSION:  PT End of Session - 04/21/24 1201     Visit Number 3    Number of Visits 5    Date for Recertification  05/11/24    Authorization Type [employer] MCR    PT Start Time 1109    PT Stop Time 1156    PT Time Calculation (min) 47 min    Activity Tolerance Patient tolerated treatment well    Behavior During Therapy WFL for tasks assessed/performed           Past Medical History:  Diagnosis Date   Allergy    seasonal   ED (erectile dysfunction)    Bertrum syndrome    Hypothyroidism    PONV (postoperative nausea and vomiting)    ~ 30 yrs ago with appendectomy   Past Surgical History:  Procedure Laterality Date   APPENDECTOMY     BASAL CELL CARCINOMA EXCISION  08/08/2014   Benign Nevi      Removed x 3/ Dr. Lorrayne Seashore    CARPAL TUNNEL RELEASE  5/12/20007   Bilateral ; Dr Murrell   COLONOSCOPY  2005 & 2011   Normal; [facility]   MENISCUS REPAIR Bilateral    POLYPECTOMY     TA x 2 2017   SHOULDER SURGERY Bilateral    chromium decompression   TONSILLECTOMY     Patient Active Problem List   Diagnosis Date Noted   Chronic left-sided low back pain without sciatica 01/27/2024   Hyperglycemia 02/13/2021   Family history of diabetes mellitus in daughter 04/10/2020   Umbilical hernia without obstruction or gangrene 01/04/2018   S/P arthroscopy of left shoulder 07/21/2017   Sciatica of left side 10/11/2015   History of basal cell carcinoma (BCC) 09/08/2014   ED (erectile dysfunction) 05/28/2012   Family history of ischemic heart disease 02/20/2012   ELEVATED PROSTATE SPECIFIC ANTIGEN 10/11/2008   Hypothyroidism 08/30/2007   BPH (benign prostatic hyperplasia) 08/30/2007    PCP: [name], MD  REFERRING PROVIDER: [name], MD  REFERRING DIAG: [phone] (ICD-10-CM) - Spinal stenosis of lumbar  region, unspecified whether neurogenic claudication present  Rationale for Evaluation and Treatment: Rehabilitation  THERAPY DIAG:  Other low back pain  ONSET DATE: Spring 2024  SUBJECTIVE:                                                                                                                                                                                           SUBJECTIVE STATEMENT: Pt states he feels  the same.  Pt has been performing TrA contractions, q-ped birddogs, and supine dead bugs.  Pt states he does feel better after performing exercises, but it's only short term.  Pt purchased a theracane and has been using it.  Pt denies any adverse effects after prior treatment.     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   Worst is when standing up after sitting awhile. Location:  L sided lumbar  PRECAUTIONS: Other: per dx and MRI findings   WEIGHT BEARING RESTRICTIONS: No  FALLS:  Has patient fallen in last 6 months? No  LIVING ENVIRONMENT: Lives with: lives with their spouse Lives in: 2 story home, but primarily stays on 1st floor Stairs: yes   OCCUPATION: retired  PLOF: Independent  PATIENT GOALS: to have no pain, learn ways to strengthen to not get worse   OBJECTIVE:  Note: Objective measures were completed at Evaluation unless otherwise noted.  DIAGNOSTIC FINDINGS:  Lumbar MRI: Disc levels:   T11-T12: Mild disc desiccation, disc space loss. Minimal disc bulging and no stenosis is evident.   T12-L1:  Negative.   L1-L2: Disc space loss and circumferential disc bulge. No spinal stenosis. Mild lateral recess and foraminal stenosis bilaterally.   L2-L3: Similar disc space loss. Circumferential disc  osteophyte complex asymmetric to the left, left far lateral component. Mild facet and ligament flavum hypertrophy. Mild spinal stenosis and moderate left lateral recess stenosis (left L3 nerve level series 105, image 19). Moderate left L2 neural foraminal stenosis. Mild if any right foraminal stenosis.   L3-L4: Disc desiccation and more symmetric circumferential disc bulging. Up to moderate facet and ligament flavum hypertrophy greater on the right. Moderate spinal stenosis (series 105, image 26). Fairly symmetric lateral recess involvement. Moderate left and mild right L3 neural foraminal stenosis (series 104, image 12 on the left).   L4-L5: Disc desiccation circumferential disc bulge. Small posterior annular fissures of the disc, including at the left lateral recess on series 104, image 12 (descending left L5 nerve level). Mild to moderate facet and ligament flavum hypertrophy. No spinal stenosis. No convincing lateral recess stenosis. Mild L4 neural foraminal stenosis greater on the right.   L5-S1: Mild retrolisthesis. Disc space loss and circumferential disc osteophyte complex with a right paracentral posterior component (series 105, image 40). Less posterior element hypertrophy here. No spinal stenosis. Mild to moderate right lateral recess stenosis (right S1 nerve level series 104, image 7). Mild to moderate right L5 neural foraminal stenosis (series 104, image 5).   IMPRESSION: 1. Dextroconvex lumbar scoliosis with multilevel disc and endplate degeneration. Moderate degenerative endplate marrow edema radiating to the left at L2-L3 (see #3). No other acute osseous abnormality. 2. Moderate multifactorial spinal stenosis at L3-L4, with moderate left neural foraminal stenosis. 3. Mild spinal stenosis at L2-L3 with moderate left lateral recess and left foraminal stenosis. 4. Mild to moderate right lateral recess and right foraminal stenosis at L5-S1.  Lumbar x  ray: FINDINGS: There is no evidence of lumbar spine fracture. Alignment is normal. Mild multilevel intervertebral disc space narrowing with anterior disc osteophyte formation spanning L1-S1 with facet arthropathy most pronounced at L4-S1.   IMPRESSION: Mild multilevel degenerative changes of the lumbar spine.  Hip x rays: IMPRESSION: 1.  Mild-to-moderate osteoarthritis of the right hip with diminished cut back of the right femoral head and neck junction and pincer morphology of the right acetabulum, which can predispose to mixed type femoroacetabular impingement. 2. Mild osteoarthritis of the left hip with diminished cut back of the left femoral head neck junction, which can predispose to CAM type femoroacetabular impingement. 3. No acute osseous abnormality.  PATIENT SURVEYS:  Modified Oswestry Disability Index:  6%  COGNITION: Overall cognitive status: Within functional limits for tasks assessed     MUSCLE LENGTH: Hamstrings:  minimal tightness in bilat HS   PALPATION: Soft tissue tightness in bilat lumbar paraspinals.  Pt has tenderness in R sided lumbar paraspinals.  LUMBAR ROM:   AROM eval  Flexion WNL  Extension WFL without pain  Right lateral flexion WFL  Left lateral flexion WFL  Right rotation WNL  Left rotation WNL   (Blank rows = not tested)  LOWER EXTREMITY ROM:     Active  Right eval Left eval  Hip flexion    Hip extension    Hip abduction [facility]  Hip adduction    Hip internal rotation    Hip external rotation    Knee flexion    Knee extension [facility]  Ankle dorsiflexion    Ankle plantarflexion    Ankle inversion    Ankle eversion     (Blank rows = not tested)  LOWER EXTREMITY MMT:    MMT Right eval Left eval  Hip flexion 5/5 5/5  Hip extension    Hip abduction 5/5 5/5  Hip adduction    Hip internal rotation    Hip external rotation 5/5 5/5  Knee flexion 5/5 5/5  Knee extension 5/5 5/5  Ankle dorsiflexion 5/5 5/5  Ankle  plantarflexion WFL seated WFL seated  Ankle inversion    Ankle eversion     (Blank rows = not tested)  LUMBAR SPECIAL TESTS:  SLR test:  negative bilat  Supine SL K to C:  Feels slightly better, but not significant.  Pt had no pain before doing exercise.     GAIT:  Assistive device utilized: None Level of assistance: Complete Independence Comments: good heel to toe gait, no limping.  Limited reciprocal arm swing.   TREATMENT:                                                                                                                                PT answered pt's questions concerning HEP, appropriate response, correct positioning, and appropriate exercises.  PT reviewed his current gym program and advised pt with appropriate vs inappropriate exercises.       Reviewed current home program Supine dead bug with TrA with feet elevated x10 reps Supine bridge x 15, with clamshells 2x10 with GTB Supine SLR with alt UE extension x 10 reps each Supine alt LE extension with TrA while holding 2#   Q-ped birddogs x 10 with TrA  Supine piriformis  stretch 3x30 sec bilat Supine lumbar rotation  Standing paloff press with TrA with GTB   PT updated HEP and gave pt a HEP handout.  PT educated pt in correct form and appropriate frequency.      PATIENT EDUCATION:  Education details:  Dx, prognosis, HEP, posture, POC, rationale of interventions, appropriate response to exercises/activities, and what to avoid.  Relevant anatomy and MRI findings.  PT answered pt's questions including appropriate exercises and activities and which ones to avoid.  PT instructed pt in avoiding extension exercises/activities.   Person educated: Patient Education method: Explanation, Demonstration, Tactile cues, Verbal cues, handout Education comprehension: verbalized understanding, returned demonstration, verbal cues required, tactile cues required, and needs further education  HOME EXERCISE PROGRAM: Access Code:  [email or web address] URL: [email or web address] Date: 03/31/2024 Prepared by: Mose Minerva  Exercises - Supine Transversus Abdominis Bracing - Hands on Stomach  - 2 x daily - 7 x weekly - 2 sets - 10 reps - 5 seconds hold - Standing Paraspinals Mobilization with Small Ball on Wall  - 7 x weekly  Access Code: [email or web address] URL: [email or web address] Date: 04/21/2024 Prepared by: Mose Minerva  Exercises - Supine Bridge with clamshells - 5 x weekly - 3 sets - 10 reps - Supine Core Control with Leg Extension  - 1 x daily - 7 x weekly - 2 sets - 10 reps - Hooklying Lumbar Rotation  - 2 x daily - 7 x weekly - 2 sets - 10 reps - Standing Anti-Rotation Press with Anchored Resistance  - 1 x daily - 5 x weekly - 2 sets - 10 reps  ASSESSMENT:  CLINICAL IMPRESSION: PT reviewed his current HEP and gym program.  PT spent extensive time with pt education and answering pt's questions including appropriate exercises and which ones to avoid.  PT educated pt in appropriate response from exercises.  PT worked on updated HEP today.  PT performed exercises to improve lumbopelvic stabilization, pain, and lumbar mobility.  He performed exercises well with cuing and instruction in correct form.  Pt received a HEP handout and was educated in correct form and appropriate frequency.  Pt demonstrates good understanding of HEP.  He responded well to treatment reporting improved pain from 3/10 before treatment to 1/10 after treatment.  He should benefit from skilled PT to address impairments and overall function.      OBJECTIVE IMPAIRMENTS: increased fascial restrictions and pain.   ACTIVITY LIMITATIONS: transfers and bed mobility  PARTICIPATION LIMITATIONS:   PERSONAL FACTORS: 1-2 comorbidities: Hip OA and L knee meniscus repair are also affecting patient's functional outcome.   REHAB POTENTIAL: Good  CLINICAL DECISION MAKING: Stable/uncomplicated  EVALUATION COMPLEXITY:  Low   GOALS:   SHORT TERM GOALS: Target date: 04/20/2024   Pt will report at least a 25% improvement in pain with transitional movements including transfers and bed mobility.  Baseline: Goal status: INITIAL    LONG TERM GOALS: Target date: 05/11/2024  Pt will demo improved soft tissue tightness and tenderness in lumbar paraspinals for reduced tension in lumbar and improved functional mobility.  Baseline:  Goal status: INITIAL  2.  Pt will report at least a 70% improvement in pain with standing up after sitting awhile and bed mobility.  Baseline:  Goal status: INITIAL  3.  Pt will be independent and compliant with HEP for improved pain, core strength, and functional mobility.  Baseline:  Goal status: INITIAL   PLAN:  PT FREQUENCY: 1x/week  PT DURATION: 5-6 weeks  PLANNED INTERVENTIONS: 02835- PT Re-evaluation,  02249- Physical Performance Testing, 97110-Therapeutic exercises, 97530- Therapeutic activity, [id number]- Neuromuscular re-education, [phone]- Self Care, 02859- Manual therapy, [phone]- Gait training, [phone]- Aquatic Therapy, [phone]- Electrical stimulation (unattended), [phone]- Electrical stimulation (manual), [id number]- Ultrasound, 79439 (1-2 muscles), [phone] (3+ muscles)- Dry Needling, Patient/Family education, Stair training, Taping, Joint mobilization, Cryotherapy, and Moist heat.  PLAN FOR NEXT SESSION: core strengthening.  STM and TPR to lumbar paraspinals.   Leigh Minerva III PT, DPT 04/22/24 11:21 AM

## 2024-04-22 ENCOUNTER — Encounter (HOSPITAL_BASED_OUTPATIENT_CLINIC_OR_DEPARTMENT_OTHER): Payer: Self-pay | Admitting: Physical Therapy

## 2024-04-27 ENCOUNTER — Encounter (HOSPITAL_BASED_OUTPATIENT_CLINIC_OR_DEPARTMENT_OTHER): Admitting: Physical Therapy

## 2024-05-03 ENCOUNTER — Ambulatory Visit (HOSPITAL_BASED_OUTPATIENT_CLINIC_OR_DEPARTMENT_OTHER): Admitting: Physical Therapy

## 2024-05-03 ENCOUNTER — Encounter (HOSPITAL_BASED_OUTPATIENT_CLINIC_OR_DEPARTMENT_OTHER): Payer: Self-pay | Admitting: Physical Therapy

## 2024-05-03 ENCOUNTER — Telehealth: Payer: Self-pay

## 2024-05-03 DIAGNOSIS — M5459 Other low back pain: Secondary | ICD-10-CM | POA: Diagnosis not present

## 2024-05-03 NOTE — Therapy (Signed)
 OUTPATIENT PHYSICAL THERAPY THORACOLUMBAR TREATMENT   Patient Name: Timothy [name] MRN: [id number] DOB:August 17, 1955, 68 y.o., male Today's Date: 05/04/2024  END OF SESSION:  PT End of Session - 05/03/24 0954     Visit Number 4    Number of Visits 5    Date for Recertification  05/11/24    PT Start Time 0940    PT Stop Time 1031    PT Time Calculation (min) 51 min    Activity Tolerance Patient tolerated treatment well    Behavior During Therapy [facility] for tasks assessed/performed            Past Medical History:  Diagnosis Date   Allergy    seasonal   ED (erectile dysfunction)    Bertrum syndrome    Hypothyroidism    PONV (postoperative nausea and vomiting)    ~ 30 yrs ago with appendectomy   Past Surgical History:  Procedure Laterality Date   APPENDECTOMY     BASAL CELL CARCINOMA EXCISION  08/08/2014   Benign Nevi      Removed x 3/ Dr. Lorrayne Seashore    CARPAL TUNNEL RELEASE  5/12/20007   Bilateral ; Dr Murrell   COLONOSCOPY  2005 & 2011   Normal; [facility]   MENISCUS REPAIR Bilateral    POLYPECTOMY     TA x 2 2017   SHOULDER SURGERY Bilateral    chromium decompression   TONSILLECTOMY     Patient Active Problem List   Diagnosis Date Noted   Chronic left-sided low back pain without sciatica 01/27/2024   Hyperglycemia 02/13/2021   Family history of diabetes mellitus in daughter 04/10/2020   Umbilical hernia without obstruction or gangrene 01/04/2018   S/P arthroscopy of left shoulder 07/21/2017   Sciatica of left side 10/11/2015   History of basal cell carcinoma (BCC) 09/08/2014   ED (erectile dysfunction) 05/28/2012   Family history of ischemic heart disease 02/20/2012   ELEVATED PROSTATE SPECIFIC ANTIGEN 10/11/2008   Hypothyroidism 08/30/2007   BPH (benign prostatic hyperplasia) 08/30/2007    PCP: [name], MD  REFERRING PROVIDER: [name], MD  REFERRING DIAG: [phone] (ICD-10-CM) - Spinal stenosis of lumbar region, unspecified whether  neurogenic claudication present  Rationale for Evaluation and Treatment: Rehabilitation  THERAPY DIAG:  Other low back pain  ONSET DATE: Spring 2024  SUBJECTIVE:                                                                                                                                                                                           SUBJECTIVE STATEMENT: Pt states he felt fine after prior treatment.  Pt  has been performing some different exercises with static holds.  He gets temporary relief though the pain returns.  He continues to get temporary relief from the HEP.  Pt states he is feeling a little better overall.  He wakes occasionally without the pain including this AM.     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   Worst is when standing up after sitting awhile. Location:  L sided lumbar  PRECAUTIONS: Other: per dx and MRI findings   WEIGHT BEARING RESTRICTIONS: No  FALLS:  Has patient fallen in last 6 months? No  LIVING ENVIRONMENT: Lives with: lives with their spouse Lives in: 2 story home, but primarily stays on 1st floor Stairs: yes   OCCUPATION: retired  PLOF: Independent  PATIENT GOALS: to have no pain, learn ways to strengthen to not get worse   OBJECTIVE:  Note: Objective measures were completed at Evaluation unless otherwise noted.  DIAGNOSTIC FINDINGS:  Lumbar MRI: Disc levels:   T11-T12: Mild disc desiccation, disc space loss. Minimal disc bulging and no stenosis is evident.   T12-L1:  Negative.   L1-L2: Disc space loss and circumferential disc bulge. No spinal stenosis. Mild lateral recess and foraminal stenosis bilaterally.   L2-L3: Similar disc space loss. Circumferential disc  osteophyte complex asymmetric to the left, left far lateral component. Mild facet and ligament flavum hypertrophy. Mild spinal stenosis and moderate left lateral recess stenosis (left L3 nerve level series 105, image 19). Moderate left L2 neural foraminal stenosis. Mild if any right foraminal stenosis.   L3-L4: Disc desiccation and more symmetric circumferential disc bulging. Up to moderate facet and ligament flavum hypertrophy greater on the right. Moderate spinal stenosis (series 105, image 26). Fairly symmetric lateral recess involvement. Moderate left and mild right L3 neural foraminal stenosis (series 104, image 12 on the left).   L4-L5: Disc desiccation circumferential disc bulge. Small posterior annular fissures of the disc, including at the left lateral recess on series 104, image 12 (descending left L5 nerve level). Mild to moderate facet and ligament flavum hypertrophy. No spinal stenosis. No convincing lateral recess stenosis. Mild L4 neural foraminal stenosis greater on the right.   L5-S1: Mild retrolisthesis. Disc space loss and circumferential disc osteophyte complex with a right paracentral posterior component (series 105, image 40). Less posterior element hypertrophy here. No spinal stenosis. Mild to moderate right lateral recess stenosis (right S1 nerve level series 104, image 7). Mild to moderate right L5 neural foraminal stenosis (series 104, image 5).   IMPRESSION: 1. Dextroconvex lumbar scoliosis with multilevel disc and endplate degeneration. Moderate degenerative endplate marrow edema radiating to the left at L2-L3 (see #3). No other acute osseous abnormality. 2. Moderate multifactorial spinal stenosis at L3-L4, with moderate left neural foraminal stenosis. 3. Mild spinal stenosis at L2-L3 with moderate left lateral recess and left foraminal stenosis. 4. Mild to moderate right lateral recess and right foraminal stenosis at L5-S1.  Lumbar x  ray: FINDINGS: There is no evidence of lumbar spine fracture. Alignment is normal. Mild multilevel intervertebral disc space narrowing with anterior disc osteophyte formation spanning L1-S1 with facet arthropathy most pronounced at L4-S1.   IMPRESSION: Mild multilevel degenerative changes of the lumbar spine.  Hip x rays: IMPRESSION: 1. Mild-to-moderate  osteoarthritis of the right hip with diminished cut back of the right femoral head and neck junction and pincer morphology of the right acetabulum, which can predispose to mixed type femoroacetabular impingement. 2. Mild osteoarthritis of the left hip with diminished cut back of the left femoral head neck junction, which can predispose to CAM type femoroacetabular impingement. 3. No acute osseous abnormality.  PATIENT SURVEYS:  Modified Oswestry Disability Index:  6%  COGNITION: Overall cognitive status: Within functional limits for tasks assessed     MUSCLE LENGTH: Hamstrings:  minimal tightness in bilat HS   PALPATION: Soft tissue tightness in bilat lumbar paraspinals.  Pt has tenderness in R sided lumbar paraspinals.  LUMBAR ROM:   AROM eval  Flexion WNL  Extension WFL without pain  Right lateral flexion WFL  Left lateral flexion WFL  Right rotation WNL  Left rotation WNL   (Blank rows = not tested)  LOWER EXTREMITY ROM:     Active  Right eval Left eval  Hip flexion    Hip extension    Hip abduction [facility]  Hip adduction    Hip internal rotation    Hip external rotation    Knee flexion    Knee extension [facility]  Ankle dorsiflexion    Ankle plantarflexion    Ankle inversion    Ankle eversion     (Blank rows = not tested)  LOWER EXTREMITY MMT:    MMT Right eval Left eval  Hip flexion 5/5 5/5  Hip extension    Hip abduction 5/5 5/5  Hip adduction    Hip internal rotation    Hip external rotation 5/5 5/5  Knee flexion 5/5 5/5  Knee extension 5/5 5/5  Ankle dorsiflexion 5/5 5/5  Ankle  plantarflexion WFL seated WFL seated  Ankle inversion    Ankle eversion     (Blank rows = not tested)  LUMBAR SPECIAL TESTS:  SLR test:  negative bilat  Supine SL K to C:  Feels slightly better, but not significant.  Pt had no pain before doing exercise.     GAIT:  Assistive device utilized: None Level of assistance: Complete Independence Comments: good heel to toe gait, no limping.  Limited reciprocal arm swing.   TREATMENT:                                                                                                                         Reviewed exercises that he has been performing from a book he brought in.  PT answered pt's questions concerning those exercises, HEP, and appropriate response. Elliptical x 5-6 mins Supine bridge with clamshells 2x12 with GTB Supine SLR with alt UE extension x 10 reps, 1# UE/LE 2# x 10 each Supine alt LE extension with TrA while holding 2#   Supine piriformis stretch 2x30 sec bilat Standing paloff press with TrA with GTB 2x12 bilat  Manual Therapy: STM to bilat lumbar paraspinals f/b static cupping to bilat lumbar paraspinals.  PT showed  pt his response in the mirror and educated him on the rationale and mechanism of cupping.     PATIENT EDUCATION:  Education details:  Dx, prognosis, HEP, posture, POC, rationale of interventions, appropriate response to exercises/activities, and what to avoid.  Relevant anatomy.  PT answered pt's questions.   Person educated: Patient Education method: Explanation, Demonstration, Tactile cues, Verbal cues, handout Education comprehension: verbalized understanding, returned demonstration, verbal cues required, tactile cues required, and needs further education  HOME EXERCISE PROGRAM: Access Code: [email or web address] URL: [email or web address] Date: 03/31/2024 Prepared by: Mose Minerva  Exercises - Supine Transversus Abdominis Bracing - Hands on Stomach  - 2 x daily - 7 x weekly - 2 sets - 10 reps  - 5 seconds hold - Standing Paraspinals Mobilization with Small Ball on Wall  - 7 x weekly  Access Code: [email or web address] URL: [email or web address] Date: 04/21/2024 Prepared by: Mose Minerva  Exercises - Supine Bridge with clamshells - 5 x weekly - 3 sets - 10 reps - Supine Core Control with Leg Extension  - 1 x daily - 7 x weekly - 2 sets - 10 reps - Hooklying Lumbar Rotation  - 2 x daily - 7 x weekly - 2 sets - 10 reps - Standing Anti-Rotation Press with Anchored Resistance  - 1 x daily - 5 x weekly - 2 sets - 10 reps  ASSESSMENT:  CLINICAL IMPRESSION: Pt continues to only receive temporary relief from his HEP though states he is feeling a little better overall.  Occasionally, he doesn't have the pain when he wakes up which is an improvement.  Pt has been performing exercises from a book that involves more static positioning and PT reviewed those and answered pt's questions.  Pt performed core exercises well without c/o's.  PT instructed pt in correct form with exercises.  Pt has improved soft tissue tightness in bilat lumbar paraspinals.  PT performed cupping to bilat lumbar paraspinals and educated pt on the process, rationale, and mechanism of cupping.  Pt had an appropriate response to cupping with multiple petichiae.  PT instructed pt to drink plenty of water today.  He responded well to treatment stating he felt better after treatment.  He should benefit from continued skilled PT to address impairments and overall function.      OBJECTIVE IMPAIRMENTS: increased fascial restrictions and pain.   ACTIVITY LIMITATIONS: transfers and bed mobility  PARTICIPATION LIMITATIONS:   PERSONAL FACTORS: 1-2 comorbidities: Hip OA and L knee meniscus repair are also affecting patient's functional outcome.   REHAB POTENTIAL: Good  CLINICAL DECISION MAKING: Stable/uncomplicated  EVALUATION COMPLEXITY: Low   GOALS:   SHORT TERM GOALS: Target date: 04/20/2024   Pt will report at  least a 25% improvement in pain with transitional movements including transfers and bed mobility.  Baseline: Goal status: INITIAL    LONG TERM GOALS: Target date: 05/11/2024  Pt will demo improved soft tissue tightness and tenderness in lumbar paraspinals for reduced tension in lumbar and improved functional mobility.  Baseline:  Goal status: INITIAL  2.  Pt will report at least a 70% improvement in pain with standing up after sitting awhile and bed mobility.  Baseline:  Goal status: INITIAL  3.  Pt will be independent and compliant with HEP for improved pain, core strength, and functional mobility.  Baseline:  Goal status: INITIAL   PLAN:  PT FREQUENCY: 1x/week  PT DURATION: 5-6 weeks  PLANNED INTERVENTIONS: 97164- PT Re-evaluation, 97750- Physical Performance Testing, 97110-Therapeutic exercises, 97530- Therapeutic activity, [id number]- Neuromuscular re-education,  02464- Self Care, 02859- Manual therapy, [id number]- Gait training, [phone]- Aquatic Therapy, [phone]- Electrical stimulation (unattended), [id number]- Electrical stimulation (manual), [id number]- Ultrasound, [id number] (1-2 muscles), 20561 (3+ muscles)- Dry Needling, Patient/Family education, Stair training, Taping, Joint mobilization, Cryotherapy, and Moist heat.  PLAN FOR NEXT SESSION: core strengthening.  STM and TPR to lumbar paraspinals.  Assess response to cupping.   Leigh Minerva III PT, DPT 05/04/24 10:07 AM

## 2024-05-03 NOTE — Telephone Encounter (Signed)
 Spoke with [place] today

## 2024-05-03 NOTE — Telephone Encounter (Signed)
 Copied from CRM #[id number]. Topic: Clinical - Medication Prior Auth >> May 03, 2024  1:35 PM Pinkey ORN wrote: Reason for CRM: levothyroxine  (SYNTHROID ) 150 MCG tablet >> May 03, 2024  1:38 PM Pinkey ORN wrote: Timothy [name] [facility] [phone] Calling on behalf of patient medication levothyroxine  (SYNTHROID ) 150 MCG tablet. States they're switching manufactures and wanted to get Timothy [name] PARAS, MD approval.

## 2024-05-09 ENCOUNTER — Ambulatory Visit

## 2024-05-09 DIAGNOSIS — L570 Actinic keratosis: Secondary | ICD-10-CM | POA: Diagnosis not present

## 2024-05-09 DIAGNOSIS — Z86018 Personal history of other benign neoplasm: Secondary | ICD-10-CM | POA: Diagnosis not present

## 2024-05-09 DIAGNOSIS — D239 Other benign neoplasm of skin, unspecified: Secondary | ICD-10-CM | POA: Diagnosis not present

## 2024-05-09 DIAGNOSIS — D225 Melanocytic nevi of trunk: Secondary | ICD-10-CM | POA: Diagnosis not present

## 2024-05-09 DIAGNOSIS — L821 Other seborrheic keratosis: Secondary | ICD-10-CM | POA: Diagnosis not present

## 2024-05-10 DIAGNOSIS — H2513 Age-related nuclear cataract, bilateral: Secondary | ICD-10-CM | POA: Diagnosis not present

## 2024-05-10 DIAGNOSIS — H43822 Vitreomacular adhesion, left eye: Secondary | ICD-10-CM | POA: Diagnosis not present

## 2024-05-10 DIAGNOSIS — H43811 Vitreous degeneration, right eye: Secondary | ICD-10-CM | POA: Diagnosis not present

## 2024-05-16 ENCOUNTER — Ambulatory Visit

## 2024-05-16 VITALS — Ht 73.0 in | Wt 184.0 lb

## 2024-05-16 DIAGNOSIS — Z0000 Encounter for general adult medical examination without abnormal findings: Secondary | ICD-10-CM

## 2024-05-16 NOTE — Patient Instructions (Signed)
 [name],  Thank you for taking the time for your Medicare Wellness Visit. I appreciate your continued commitment to your health goals. Please review the care plan we discussed, and feel free to reach out if I can assist you further.  Please note that Annual Wellness Visits do not include a physical exam. Some assessments may be limited, especially if the visit was conducted virtually. If needed, we may recommend an in-person follow-up with your provider.  Ongoing Care Seeing your primary care provider every 3 to 6 months helps us  monitor your health and provide consistent, personalized care.   Referrals If a referral was made during today's visit and you haven't received any updates within two weeks, please contact the referred provider directly to check on the status.  Recommended Screenings:  Health Maintenance  Topic Date Due   Pneumococcal Vaccine for age over 25 (1 of 1 - PCV) Never done   Flu Shot  01/08/2024   Medicare Annual Wellness Visit  05/04/2024   DTaP/Tdap/Td vaccine (2 - Td or Tdap) 10/10/2025   Colon Cancer Screening  08/29/2026   Hepatitis C Screening  Completed   Zoster (Shingles) Vaccine  Completed   Meningitis B Vaccine  Aged Out   COVID-19 Vaccine  Discontinued       05/16/2024    1:43 PM  Advanced Directives  Does Patient Have a Medical Advance Directive? Yes  Type of Estate Agent of [place];Living will  Does patient want to make changes to medical advance directive? Yes (Inpatient - patient requests chaplain consult to change a medical advance directive)  Copy of Healthcare Power of Attorney in Chart? No - copy requested    Vision: Annual vision screenings are recommended for early detection of glaucoma, cataracts, and diabetic retinopathy. These exams can also reveal signs of chronic conditions such as diabetes and high blood pressure.  Dental: Annual dental screenings help detect early signs of oral cancer, gum disease, and  other conditions linked to overall health, including heart disease and diabetes.

## 2024-05-16 NOTE — Progress Notes (Signed)
 Chief Complaint  Patient presents with   Medicare Wellness     Subjective:   Timothy [name] is a 68 y.o. male who presents for a Medicare Annual Wellness Visit.  I connected with  Timothy [name] on 05/16/24 by a audio enabled telemedicine application and verified that I am speaking with the correct person using two identifiers.  Patient Location: Home  Provider Location: Office/Clinic  Persons Participating in Visit: Patient.  I discussed the limitations of evaluation and management by telemedicine. The patient expressed understanding and agreed to proceed.  Vital Signs: Because this visit was a virtual/telehealth visit, some criteria may be missing or patient reported. Any vitals not documented were not able to be obtained and vitals that have been documented are patient reported.   Visit info / Clinical Intake: Medicare Wellness Visit Type:: Initial Annual Wellness Visit Persons participating in visit and providing information:: patient Medicare Wellness Visit Mode:: Telephone If telephone:: video declined Since this visit was completed virtually, some vitals may be partially provided or unavailable. Missing vitals are due to the limitations of the virtual format.: Documented vitals are patient reported If Telephone or Video please confirm:: I connected with patient using audio/video enable telemedicine. I verified patient identity with two identifiers, discussed telehealth limitations, and patient agreed to proceed. Patient Location:: Home Provider Location:: Office Interpreter Needed?: No Pre-visit prep was completed: yes AWV questionnaire completed by patient prior to visit?: yes Date:: 04/25/24 Living arrangements:: lives with spouse/significant other Patient's Overall Health Status Rating: very good Typical amount of pain: none Does pain affect daily life?: no Are you currently prescribed opioids?: no  Dietary Habits and Nutritional Risks How many meals a day?:  2 Eats fruit and vegetables daily?: yes Most meals are obtained by: preparing own meals; eating out; having others provide food Diabetic:: no  Functional Status Activities of Daily Living (to include ambulation/medication): Independent Ambulation: Independent Medication Administration: Independent Home Management (perform basic housework or laundry): Independent Manage your own finances?: yes Primary transportation is: driving Concerns about vision?: no *vision screening is required for WTM* Concerns about hearing?: no  Fall Screening Falls in the past year?: 0 Number of falls in past year: 0 Was there an injury with Fall?: 0 Fall Risk Category Calculator: 0 Patient Fall Risk Level: Low Fall Risk  Fall Risk Patient at Risk for Falls Due to: No Fall Risks Fall risk Follow up: Falls evaluation completed; Falls prevention discussed  Home and Transportation Safety: All rugs have non-skid backing?: N/A, no rugs All stairs or steps have railings?: yes Grab bars in the bathtub or shower?: (!) no Have non-skid surface in bathtub or shower?: yes Good home lighting?: yes Regular seat belt use?: yes Hospital stays in the last year:: no  Cognitive Assessment Difficulty concentrating, remembering, or making decisions? : no Will 6CIT or Mini Cog be Completed: yes What year is it?: 0 points What month is it?: 0 points Give patient an address phrase to remember (5 components): [address] [place] what time is it?: 0 points Count backwards from 20 to 1: 0 points Say the months of the year in reverse: 0 points Repeat the address phrase from earlier: 0 points 6 CIT Score: 0 points  Advance Directives (For Healthcare) Does Patient Have a Medical Advance Directive?: Yes Does patient want to make changes to medical advance directive?: Yes (Inpatient - patient requests chaplain consult to change a medical advance directive) Type of Advance Directive: Healthcare Power of  [place];  Living will Copy of Healthcare Power of Attorney in Chart?: No - copy requested Copy of Living Will in Chart?: No - copy requested  Reviewed/Updated  Reviewed/Updated: Reviewed All (Medical, Surgical, Family, Medications, Allergies, Care Teams, Patient Goals)    Allergies (verified) Patient has no known allergies.   Current Medications (verified) Outpatient Encounter Medications as of 05/16/2024  Medication Sig   cetirizine (ZYRTEC) 10 MG tablet Take 10 mg by mouth daily.   clobetasol cream (TEMOVATE) 0.05 % Apply 1 application. topically 2 (two) times daily.   ibuprofen (ADVIL) 600 MG tablet Take 600 mg by mouth every 6 (six) hours as needed.   levothyroxine  (SYNTHROID ) 150 MCG tablet TAKE 1 AND 1/2 TABLETS BY MOUTH DAILY EXCEPT TAKE 1 TABLET ON TUESDAYS- THURSDAYS- AND SATURDAYS   loratadine (CLARITIN) 10 MG tablet Take 10 mg by mouth daily.   metroNIDAZOLE (METROGEL) 0.75 % gel Apply topically 2 (two) times daily.   tadalafil  (CIALIS ) 20 MG tablet Take 1 tablet (20 mg total) by mouth every other day as needed for erectile dysfunction.   triamcinolone  ointment (KENALOG ) 0.1 % Apply topically.   No facility-administered encounter medications on file as of 05/16/2024.    History: Past Medical History:  Diagnosis Date   Allergy    seasonal   ED (erectile dysfunction)    Bertrum syndrome    Hypothyroidism    PONV (postoperative nausea and vomiting)    ~ 30 yrs ago with appendectomy   Past Surgical History:  Procedure Laterality Date   APPENDECTOMY     BASAL CELL CARCINOMA EXCISION  08/08/2014   Benign Nevi      Removed x 3/ Dr. Lorrayne Seashore    CARPAL TUNNEL RELEASE  5/12/20007   Bilateral ; Dr Murrell   COLONOSCOPY  2005 & 2011   Normal; [facility]   MENISCUS REPAIR Bilateral    POLYPECTOMY     TA x 2 2017   SHOULDER SURGERY Bilateral    chromium decompression   TONSILLECTOMY     Family History  Problem Relation Age of Onset   Colon polyps Mother     Hypothyroidism Mother    Heart attack Mother 32       smoker   Colonic polyp Mother    Hypothyroidism Father    Colon polyps Father    Heart attack Father 76       smoker   Melanoma Father    Lung cancer Father    Hypothyroidism Brother         X 3   Melanoma Brother    Diabetes Daughter        Type 1   Stroke Neg Hx    Colon cancer Neg Hx    Esophageal cancer Neg Hx    Stomach cancer Neg Hx    Rectal cancer Neg Hx    Social History   Occupational History   Not on file  Tobacco Use   Smoking status: Never   Smokeless tobacco: Never  Substance and Sexual Activity   Alcohol use: Not Currently    Alcohol/week: 1.0 standard drink of alcohol    Types: 1 Cans of beer per week    Comment:  15 servings of beer or wine   Drug use: No   Sexual activity: Yes   Tobacco Counseling Counseling given: Not Answered  SDOH Screenings   Food Insecurity: No Food Insecurity (05/16/2024)  Housing: Low Risk  (05/16/2024)  Transportation Needs: No Transportation Needs (05/16/2024)  Utilities: Not At  Risk (05/16/2024)  Alcohol Screen: Medium Risk (05/05/2024)  Depression (PHQ2-9): Low Risk  (05/16/2024)  Financial Resource Strain: Low Risk  (05/05/2024)  Physical Activity: Sufficiently Active (05/16/2024)  Social Connections: Socially Integrated (05/16/2024)  Stress: No Stress Concern Present (05/16/2024)  Tobacco Use: Low Risk  (05/16/2024)  Health Literacy: Adequate Health Literacy (05/16/2024)   See flowsheets for full screening details  Depression Screen PHQ 2 & 9 Depression Scale- Over the past 2 weeks, how often have you been bothered by any of the following problems? Little interest or pleasure in doing things: 0 Feeling down, depressed, or hopeless (PHQ Adolescent also includes...irritable): 0 PHQ-2 Total Score: 0 Trouble falling or staying asleep, or sleeping too much: 0 Feeling tired or having little energy: 0 Poor appetite or overeating (PHQ Adolescent also includes...weight  loss): 0 Feeling bad about yourself - or that you are a failure or have let yourself or your family down: 0 Trouble concentrating on things, such as reading the newspaper or watching television (PHQ Adolescent also includes...like school work): 0 Moving or speaking so slowly that other people could have noticed. Or the opposite - being so fidgety or restless that you have been moving around a lot more than usual: 0 Thoughts that you would be better off dead, or of hurting yourself in some way: 0 PHQ-9 Total Score: 0 If you checked off any problems, how difficult have these problems made it for you to do your work, take care of things at home, or get along with other people?: Not difficult at all  Depression Treatment Depression Interventions/Treatment : EYV7-0 Score <4 Follow-up Not Indicated     Goals Addressed               This Visit's Progress     Patient Stated (pt-stated)        Patient stated he plans to continue to stay active - exercising (plays pickleball)             Objective:    Today's Vitals   05/16/24 1340  Weight: 184 lb (83.5 kg)  Height: 6' 1 (1.854 m)   Body mass index is 24.28 kg/m.  Hearing/Vision screen Hearing Screening - Comments:: Denies hearing difficulties   Vision Screening - Comments:: Wears rx glasses for reading - up to date with routine eye exams with Dr Raelyn Immunizations and Health Maintenance Health Maintenance  Topic Date Due   Pneumococcal Vaccine: 50+ Years (1 of 1 - PCV) Never done   Influenza Vaccine  09/06/2024 (Originally 01/08/2024)   Medicare Annual Wellness (AWV)  05/16/2025   DTaP/Tdap/Td (2 - Td or Tdap) 10/10/2025   Colonoscopy  08/29/2026   Hepatitis C Screening  Completed   Zoster Vaccines- Shingrix   Completed   Meningococcal B Vaccine  Aged Out   COVID-19 Vaccine  Discontinued        Assessment/Plan:  This is a routine wellness examination for Timothy [name]. I have recommended that this patient have a Influenza  vaccine but he declines at this time. I have discussed the risks and benefits of this procedure with him. The patient verbalizes understanding.   Patient Care Team: Geofm Glade PARAS, MD as PCP - General (Internal Medicine) [facility])  I have personally reviewed and noted the following in the patient's chart:   Medical and social history Use of alcohol, tobacco or illicit drugs  Current medications and supplements including opioid prescriptions. Functional ability and status Nutritional status Physical activity Advanced directives List of other physicians Hospitalizations,  surgeries, and ER visits in previous 12 months Vitals Screenings to include cognitive, depression, and falls Referrals and appointments  No orders of the defined types were placed in this encounter.  In addition, I have reviewed and discussed with patient certain preventive protocols, quality metrics, and best practice recommendations. A written personalized care plan for preventive services as well as general preventive health recommendations were provided to patient.   Timothy [name], CMA   05/16/2024   Return in 1 year (on 05/16/2025).  After Visit Summary: (MyChart) Due to this being a telephonic visit, the after visit summary with patients personalized plan was offered to patient via MyChart   Nurse Notes: scheduled 2026 AWV/CPE appts.

## 2024-05-18 ENCOUNTER — Encounter (HOSPITAL_BASED_OUTPATIENT_CLINIC_OR_DEPARTMENT_OTHER): Payer: Self-pay | Admitting: Physical Therapy

## 2024-05-18 ENCOUNTER — Ambulatory Visit (HOSPITAL_BASED_OUTPATIENT_CLINIC_OR_DEPARTMENT_OTHER): Payer: Self-pay | Attending: Family Medicine | Admitting: Physical Therapy

## 2024-05-18 ENCOUNTER — Encounter: Payer: Self-pay | Admitting: Internal Medicine

## 2024-05-18 DIAGNOSIS — M5459 Other low back pain: Secondary | ICD-10-CM | POA: Insufficient documentation

## 2024-05-18 NOTE — Progress Notes (Signed)
 Subjective:    Patient ID: Timothy [name], male    DOB: 03/18/1956, 68 y.o.   MRN: [id number]     HPI Timothy [name] is here for a physical exam and his chronic medical problems.  Hernia surgery   2 minor bulging discs and stenosis.   Sensitive teeth on right side.  Not sure why and is thinking about calling his dentist for further evaluation    Medications and allergies reviewed with patient and updated if appropriate.  Current Outpatient Medications on File Prior to Visit  Medication Sig Dispense Refill   cetirizine (ZYRTEC) 10 MG tablet Take 10 mg by mouth daily.     clobetasol cream (TEMOVATE) 0.05 % Apply 1 application. topically 2 (two) times daily.     ibuprofen (ADVIL) 600 MG tablet Take 600 mg by mouth every 6 (six) hours as needed.     levothyroxine  (SYNTHROID ) 150 MCG tablet TAKE 1 AND 1/2 TABLETS BY MOUTH DAILY EXCEPT TAKE 1 TABLET ON TUESDAYS- THURSDAYS- AND SATURDAYS 114 tablet 2   loratadine (CLARITIN) 10 MG tablet Take 10 mg by mouth daily.     metroNIDAZOLE (METROGEL) 0.75 % gel Apply topically 2 (two) times daily.     tadalafil  (CIALIS ) 20 MG tablet Take 1 tablet (20 mg total) by mouth every other day as needed for erectile dysfunction. 5 tablet 11   triamcinolone  ointment (KENALOG ) 0.1 % Apply topically.     No current facility-administered medications on file prior to visit.    Review of Systems  Constitutional:  Negative for fever.  Eyes:  Positive for visual disturbance (saw eye doctor).  Respiratory:  Negative for cough, shortness of breath and wheezing.   Cardiovascular:  Negative for chest pain, palpitations and leg swelling.  Gastrointestinal:  Negative for abdominal pain, blood in stool, constipation and diarrhea.       No gerd  Genitourinary:  Negative for difficulty urinating, dysuria and hematuria.  Musculoskeletal:  Positive for back pain (chronic, mild). Negative for arthralgias (diffuse).  Skin:  Negative for rash.  Neurological:  Negative for  light-headedness and headaches.  Psychiatric/Behavioral:  Negative for dysphoric mood. The patient is not nervous/anxious.        Objective:   Vitals:   05/19/24 0909  BP: 126/82  Pulse: (!) 44  Temp: 98.1 F (36.7 C)  SpO2: 97%   Filed Weights   05/19/24 0909  Weight: 188 lb (85.3 kg)   Body mass index is 24.8 kg/m.  BP Readings from Last 3 Encounters:  05/19/24 126/82  02/10/24 120/82  02/01/24 124/86    Wt Readings from Last 3 Encounters:  05/19/24 188 lb (85.3 kg)  05/16/24 184 lb (83.5 kg)  02/10/24 184 lb (83.5 kg)      Physical Exam Constitutional: He appears well-developed and well-nourished. No distress.  HENT:  Head: Normocephalic and atraumatic.  Right Ear: External ear normal.  Left Ear: External ear normal.  Normal ear canals and TM b/l  Mouth/Throat: Oropharynx is clear and moist.  No obvious gum swelling or abscess. Eyes: Conjunctivae and EOM are normal.  Neck: Neck supple. No tracheal deviation present. No thyromegaly present.  No carotid bruit  Cardiovascular: Normal rate, regular rhythm, normal heart sounds and intact distal pulses.   No murmur heard.  No lower extremity edema. Pulmonary/Chest: Effort normal and breath sounds normal. No respiratory distress. He has no wheezes. He has no rales.  Abdominal: Soft. He exhibits no distension. There is no tenderness.  Genitourinary: deferred  Lymphadenopathy:   He has no cervical adenopathy.  Skin: Skin is warm and dry. He is not diaphoretic.  Psychiatric: He has a normal mood and affect. His behavior is normal.         Assessment & Plan:   Physical exam: Screening blood work  ordered Exercise   regular  - pickelball Weight  normal Substance abuse   none   Reviewed recommended immunizations. Prevnar 20 today   Health Maintenance  Topic Date Due   Pneumococcal Vaccine: 50+ Years (1 of 1 - PCV) Never done   Influenza Vaccine  09/06/2024 (Originally 01/08/2024)   Medicare Annual  Wellness (AWV)  05/16/2025   DTaP/Tdap/Td (2 - Td or Tdap) 10/10/2025   Colonoscopy  08/29/2026   Hepatitis C Screening  Completed   Zoster Vaccines- Shingrix   Completed   Meningococcal B Vaccine  Aged Out   COVID-19 Vaccine  Discontinued     See Problem List for Assessment and Plan of chronic medical problems.

## 2024-05-18 NOTE — Therapy (Addendum)
 OUTPATIENT PHYSICAL THERAPY THORACOLUMBAR TREATMENT      PROGRESS NOTE  Patient Name: Timothy [name] MRN: [id number] DOB:Mar 25, 1956, 68 y.o., male Today's Date: 05/19/2024  END OF SESSION:  PT End of Session - 05/18/24 0850     Visit Number 5    Number of Visits 8    Date for Recertification  07/13/24    Authorization Type BCBS MCR    PT Start Time 0805    PT Stop Time 0849    PT Time Calculation (min) 44 min    Activity Tolerance Patient tolerated treatment well    Behavior During Therapy [facility] for tasks assessed/performed             Past Medical History:  Diagnosis Date   Allergy    seasonal   ED (erectile dysfunction)    Bertrum syndrome    Hypothyroidism    PONV (postoperative nausea and vomiting)    ~ 30 yrs ago with appendectomy   Past Surgical History:  Procedure Laterality Date   APPENDECTOMY     BASAL CELL CARCINOMA EXCISION  08/08/2014   Benign Nevi      Removed x 3/ Dr. Lorrayne Seashore    CARPAL TUNNEL RELEASE  5/12/20007   Bilateral ; Dr Murrell   COLONOSCOPY  2005 & 2011   Normal; [facility]   MENISCUS REPAIR Bilateral    POLYPECTOMY     TA x 2 2017   SHOULDER SURGERY Bilateral    chromium decompression   TONSILLECTOMY     UMBILICAL HERNIA REPAIR     2025   Patient Active Problem List   Diagnosis Date Noted   Chronic left-sided low back pain without sciatica 01/27/2024   Prediabetes 02/13/2021   Family history of diabetes mellitus in daughter 04/10/2020   S/P arthroscopy of left shoulder 07/21/2017   Sciatica of left side 10/11/2015   History of basal cell carcinoma (BCC) 09/08/2014   ED (erectile dysfunction) 05/28/2012   Family history of ischemic heart disease 02/20/2012   ELEVATED PROSTATE SPECIFIC ANTIGEN 10/11/2008   Hypothyroidism 08/30/2007   BPH (benign prostatic hyperplasia) 08/30/2007    PCP: [name], MD  REFERRING PROVIDER: [name], MD  REFERRING DIAG: [phone] (ICD-10-CM) - Spinal stenosis of lumbar region,  unspecified whether neurogenic claudication present  Rationale for Evaluation and Treatment: Rehabilitation  THERAPY DIAG:  Other low back pain  ONSET DATE: Spring 2024  SUBJECTIVE:  SUBJECTIVE STATEMENT: Pt states he felt a difference with cupping after prior treatment.   Pt states if he was at a pain level 3-4/10 at the beginning of treatment, he is now at a 1-3/10.  When I get up in the AM, it can be a 0/10 then it slowly tightens up again.  Pt states his pain does not limit him with activities.  Pt reports improved pain 1st thing in AM, rolling over in bed, and the transition from lying to sitting.  Pt reports almost a 50% improvement in pain with transitional movements and a 50% improvement in pain with standing up after sitting awhile and bed mobility.   Pt is compliant with HEP.  He states the exercises bring him relief.     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   Worst is when standing up after sitting awhile. Location:  L sided lumbar  PRECAUTIONS: Other: per dx and MRI findings   WEIGHT BEARING RESTRICTIONS: No  FALLS:  Has patient fallen in last 6 months? No  LIVING ENVIRONMENT: Lives with: lives with their spouse Lives in: 2 story home, but primarily stays on 1st floor Stairs: yes   OCCUPATION: retired  PLOF: Independent  PATIENT GOALS: to have no pain, learn ways to strengthen to not get worse   OBJECTIVE:  Note: Objective measures were completed at Evaluation unless otherwise noted.  DIAGNOSTIC FINDINGS:  Lumbar  MRI: Disc levels:   T11-T12: Mild disc desiccation, disc space loss. Minimal disc bulging and no stenosis is evident.   T12-L1:  Negative.   L1-L2: Disc space loss and circumferential disc bulge. No spinal stenosis. Mild lateral recess and foraminal stenosis bilaterally.   L2-L3: Similar disc space loss. Circumferential disc osteophyte complex asymmetric to the left, left far lateral component. Mild facet and ligament flavum hypertrophy. Mild spinal stenosis and moderate left lateral recess stenosis (left L3 nerve level series 105, image 19). Moderate left L2 neural foraminal stenosis. Mild if any right foraminal stenosis.   L3-L4: Disc desiccation and more symmetric circumferential disc bulging. Up to moderate facet and ligament flavum hypertrophy greater on the right. Moderate spinal stenosis (series 105, image 26). Fairly symmetric lateral recess involvement. Moderate left and mild right L3 neural foraminal stenosis (series 104, image 12 on the left).   L4-L5: Disc desiccation circumferential disc bulge. Small posterior annular fissures of the disc, including at the left lateral recess on series 104, image 12 (descending left L5 nerve level). Mild to moderate facet and ligament flavum hypertrophy. No spinal stenosis. No convincing lateral recess stenosis. Mild L4 neural foraminal stenosis greater on the right.   L5-S1: Mild retrolisthesis. Disc space loss and circumferential disc osteophyte complex with a right paracentral posterior component (series 105, image 40). Less posterior element hypertrophy here. No spinal stenosis. Mild to moderate right lateral recess stenosis (right S1 nerve level series 104, image 7). Mild to moderate right L5 neural foraminal stenosis (series 104, image 5).   IMPRESSION: 1. Dextroconvex lumbar scoliosis with multilevel disc and endplate degeneration. Moderate degenerative endplate marrow edema radiating to the left at L2-L3 (see #3). No  other acute osseous abnormality. 2. Moderate multifactorial spinal stenosis at L3-L4, with moderate left neural foraminal stenosis.  3. Mild spinal stenosis at L2-L3 with moderate left lateral recess and left foraminal stenosis. 4. Mild to moderate right lateral recess and right foraminal stenosis at L5-S1.  Lumbar x ray: FINDINGS: There is no evidence of lumbar spine fracture. Alignment is normal. Mild multilevel intervertebral disc space narrowing with anterior disc osteophyte formation spanning L1-S1 with facet arthropathy most pronounced at L4-S1.   IMPRESSION: Mild multilevel degenerative changes of the lumbar spine.  Hip x rays: IMPRESSION: 1. Mild-to-moderate osteoarthritis of the right hip with diminished cut back of the right femoral head and neck junction and pincer morphology of the right acetabulum, which can predispose to mixed type femoroacetabular impingement. 2. Mild osteoarthritis of the left hip with diminished cut back of the left femoral head neck junction, which can predispose to CAM type femoroacetabular impingement. 3. No acute osseous abnormality.  PATIENT SURVEYS:  Modified Oswestry Disability Index:  6% Initial 3% current  COGNITION: Overall cognitive status: Within functional limits for tasks assessed     MUSCLE LENGTH: Hamstrings:  minimal tightness in bilat HS   PALPATION: Soft tissue tightness in bilat lumbar paraspinals.  Pt has tenderness in R sided lumbar paraspinals.  LUMBAR ROM:   AROM eval  Flexion WNL  Extension WFL without pain  Right lateral flexion WFL  Left lateral flexion WFL  Right rotation WNL  Left rotation WNL   (Blank rows = not tested)  LOWER EXTREMITY ROM:     Active  Right eval Left eval  Hip flexion    Hip extension    Hip abduction [facility]  Hip adduction    Hip internal rotation    Hip external rotation    Knee flexion    Knee extension [facility]  Ankle dorsiflexion    Ankle plantarflexion    Ankle  inversion    Ankle eversion     (Blank rows = not tested)  LOWER EXTREMITY MMT:    MMT Right eval Left eval  Hip flexion 5/5 5/5  Hip extension    Hip abduction 5/5 5/5  Hip adduction    Hip internal rotation    Hip external rotation 5/5 5/5  Knee flexion 5/5 5/5  Knee extension 5/5 5/5  Ankle dorsiflexion 5/5 5/5  Ankle plantarflexion WFL seated WFL seated  Ankle inversion    Ankle eversion     (Blank rows = not tested)     GAIT:  Assistive device utilized: None Level of assistance: Complete Independence Comments: good heel to toe gait, no limping.  Limited reciprocal arm swing.   TREATMENT:                                                                                                                         Reviewed goals, current function, response to prior treatment, and HEP compliance.  Elliptical x 5 mins  Gait:  No change in gait.  Good heel to toe gait.  Pt has no limp.  He does have limited reciprocal arm  swing.    Manual Therapy: STM to bilat lumbar paraspinals f/b static and dynamic cupping to bilat lumbar paraspinals.  PT showed pt his response in the mirror and educated him on the rationale and mechanism of cupping.     PATIENT EDUCATION:  Education details:  Dx, prognosis, HEP, posture, POC, rationale of interventions, appropriate response to exercises/activities, and goal progress.  Relevant anatomy.  PT answered pt's questions.   Person educated: Patient Education method: Explanation, Demonstration, handout Education comprehension:  verbalized understanding, returned demonstration  HOME EXERCISE PROGRAM: Access Code: [email or web address] URL: [email or web address] Date: 03/31/2024 Prepared by: Mose Minerva  Exercises - Supine Transversus Abdominis Bracing - Hands on Stomach  - 2 x daily - 7 x weekly - 2 sets - 10 reps - 5 seconds hold - Standing Paraspinals Mobilization with Small Ball on Wall  - 7 x weekly  Access Code: [email or web address] URL:  [email or web address] Date: 04/21/2024 Prepared by: Mose Minerva  Exercises - Supine Bridge with clamshells - 5 x weekly - 3 sets - 10 reps - Supine Core Control with Leg Extension  - 1 x daily - 7 x weekly - 2 sets - 10 reps - Hooklying Lumbar Rotation  - 2 x daily - 7 x weekly - 2 sets - 10 reps - Standing Anti-Rotation Press with Anchored Resistance  - 1 x daily - 5 x weekly - 2 sets - 10 reps  ASSESSMENT:  CLINICAL IMPRESSION: Pt is making progress with pain and sx's.  Pt reports almost a 50% improvement in pain with transitional movements and a 50% improvement in pain with standing up after sitting awhile and bed mobility.  He continues to have pain and tightness though states his pain does not limit his activities.  Pt reports improved pain 1st thing in AM, rolling over in bed, and the transition from lying to sitting.  His worst pain is when standing up after sitting awhile.  Pt is compliant with HEP and he gets relief from the exercises.  Pt demonstrated improved self perceived disability with Modified Oswestry improvement.  Pt has met 1/1 STG's and 1/3 LTG's and is progressing toward the other 2 LTG's.  PT has started myofacial cupping to improve soft tissue tightness and mobility and pain.  Pt had an appropriate response to cupping.  PT instructed pt to drink of plenty of water.  Pt should benefit from 2-3 more visits to improve pain and sx's and core strength and stabilization.    OBJECTIVE IMPAIRMENTS: increased fascial restrictions and pain.   ACTIVITY LIMITATIONS: transfers and bed mobility  PARTICIPATION LIMITATIONS:   PERSONAL FACTORS: 1-2 comorbidities: Hip OA and L knee meniscus repair are also affecting patient's functional outcome.   REHAB POTENTIAL: Good  CLINICAL DECISION MAKING: Stable/uncomplicated  EVALUATION COMPLEXITY: Low   GOALS:   SHORT TERM GOALS: Target date: 04/20/2024   Pt will report at least a 25% improvement in pain with  transitional movements including transfers and bed mobility.  Baseline: Goal status: GOAL MET  12/10    LONG TERM GOALS: Target date: 07/13/2024  Pt will demo improved soft tissue tightness and tenderness in lumbar paraspinals for reduced tension in lumbar and improved functional mobility.  Baseline:  Goal status: PROGRESSING  12/10  2.  Pt will report at least a 70% improvement in pain with standing up after sitting awhile and bed mobility.  Baseline:  Goal status: PROGRESSING  12/10  3.  Pt will be independent and compliant with HEP for improved pain, core strength,  and functional mobility.  Baseline:  Goal status: GOAL MET 12/10   PLAN:  PT FREQUENCY: 2-3 more visits  PT DURATION: 8 weeks  PLANNED INTERVENTIONS: 97164- PT Re-evaluation, 97750- Physical Performance Testing, 97110-Therapeutic exercises, 97530- Therapeutic activity, [id number]- Neuromuscular re-education, 97535- Self Care, 02859- Manual therapy, [phone]- Gait training, [id number]- Aquatic Therapy, [phone]- Electrical stimulation (unattended), [phone]- Electrical stimulation (manual), [id number]- Ultrasound, 79439 (1-2 muscles), 20561 (3+ muscles)- Dry Needling, Patient/Family education, Stair training, Taping, Joint mobilization, Cryotherapy, and Moist heat.  PLAN FOR NEXT SESSION: core strengthening.  STM and TPR to lumbar paraspinals.  Cont with cupping.   Leigh Minerva III PT, DPT 05/19/2024 9:56 PM

## 2024-05-18 NOTE — Patient Instructions (Addendum)
 Pneumonia vaccine given.    Blood work was ordered.       Medications changes include :   None    Return in about 1 year (around 05/19/2025) for Physical Exam.    Health Maintenance, Male Adopting a healthy lifestyle and getting preventive care are important in promoting health and wellness. Ask your health care provider about: The right schedule for you to have regular tests and exams. Things you can do on your own to prevent diseases and keep yourself healthy. What should I know about diet, weight, and exercise? Eat a healthy diet  Eat a diet that includes plenty of vegetables, fruits, low-fat dairy products, and lean protein. Do not eat a lot of foods that are high in solid fats, added sugars, or sodium. Maintain a healthy weight Body mass index (BMI) is a measurement that can be used to identify possible weight problems. It estimates body fat based on height and weight. Your health care provider can help determine your BMI and help you achieve or maintain a healthy weight. Get regular exercise Get regular exercise. This is one of the most important things you can do for your health. Most adults should: Exercise for at least 150 minutes each week. The exercise should increase your heart rate and make you sweat (moderate-intensity exercise). Do strengthening exercises at least twice a week. This is in addition to the moderate-intensity exercise. Spend less time sitting. Even light physical activity can be beneficial. Watch cholesterol and blood lipids Have your blood tested for lipids and cholesterol at 66 years of age, then have this test every 5 years. You may need to have your cholesterol levels checked more often if: Your lipid or cholesterol levels are high. You are older than 66 years of age. You are at high risk for heart disease. What should I know about cancer screening? Many types of cancers can be detected early and may often be prevented. Depending on your  health history and family history, you may need to have cancer screening at various ages. This may include screening for: Colorectal cancer. Prostate cancer. Skin cancer. Lung cancer. What should I know about heart disease, diabetes, and high blood pressure? Blood pressure and heart disease High blood pressure causes heart disease and increases the risk of stroke. This is more likely to develop in people who have high blood pressure readings or are overweight. Talk with your health care provider about your target blood pressure readings. Have your blood pressure checked: Every 3-5 years if you are 57-70 years of age. Every year if you are 70 years old or older. If you are between the ages of 13 and 93 and are a current or former smoker, ask your health care provider if you should have a one-time screening for abdominal aortic aneurysm (AAA). Diabetes Have regular diabetes screenings. This checks your fasting blood sugar level. Have the screening done: Once every three years after age 90 if you are at a normal weight and have a low risk for diabetes. More often and at a younger age if you are overweight or have a high risk for diabetes. What should I know about preventing infection? Hepatitis B If you have a higher risk for hepatitis B, you should be screened for this virus. Talk with your health care provider to find out if you are at risk for hepatitis B infection. Hepatitis C Blood testing is recommended for: Everyone born from [birth year] through 1963. Anyone with known risk factors for hepatitis  C. Sexually transmitted infections (STIs) You should be screened each year for STIs, including gonorrhea and chlamydia, if: You are sexually active and are younger than 66 years of age. You are older than 66 years of age and your health care provider tells you that you are at risk for this type of infection. Your sexual activity has changed since you were last screened, and you are at increased risk  for chlamydia or gonorrhea. Ask your health care provider if you are at risk. Ask your health care provider about whether you are at high risk for HIV. Your health care provider may recommend a prescription medicine to help prevent HIV infection. If you choose to take medicine to prevent HIV, you should first get tested for HIV. You should then be tested every 3 months for as long as you are taking the medicine. Follow these instructions at home: Alcohol use Do not drink alcohol if your health care provider tells you not to drink. If you drink alcohol: Limit how much you have to 0-2 drinks a day. Know how much alcohol is in your drink. In the [place] one drink equals one 12 oz bottle of beer (355 mL), one 5 oz glass of wine (148 mL), or one 1 oz glass of hard liquor (44 mL). Lifestyle Do not use any products that contain nicotine or tobacco. These products include cigarettes, chewing tobacco, and vaping devices, such as e-cigarettes. If you need help quitting, ask your health care provider. Do not use street drugs. Do not share needles. Ask your health care provider for help if you need support or information about quitting drugs. General instructions Schedule regular health, dental, and eye exams. Stay current with your vaccines. Tell your health care provider if: You often feel depressed. You have ever been abused or do not feel safe at home. Summary Adopting a healthy lifestyle and getting preventive care are important in promoting health and wellness. Follow your health care provider's instructions about healthy diet, exercising, and getting tested or screened for diseases. Follow your health care provider's instructions on monitoring your cholesterol and blood pressure. This information is not intended to replace advice given to you by your health care provider. Make sure you discuss any questions you have with your health care provider. Document Revised: 10/15/2020 Document Reviewed:  10/15/2020 Elsevier Patient Education  2024 [employer].

## 2024-05-19 ENCOUNTER — Ambulatory Visit: Admitting: Internal Medicine

## 2024-05-19 ENCOUNTER — Ambulatory Visit: Payer: Self-pay | Admitting: Internal Medicine

## 2024-05-19 ENCOUNTER — Encounter: Payer: Self-pay | Admitting: Internal Medicine

## 2024-05-19 ENCOUNTER — Encounter (HOSPITAL_BASED_OUTPATIENT_CLINIC_OR_DEPARTMENT_OTHER): Payer: Self-pay | Admitting: Physical Therapy

## 2024-05-19 VITALS — BP 126/82 | HR 44 | Temp 98.1°F | Ht 73.0 in | Wt 188.0 lb

## 2024-05-19 DIAGNOSIS — E038 Other specified hypothyroidism: Secondary | ICD-10-CM | POA: Diagnosis not present

## 2024-05-19 DIAGNOSIS — Z23 Encounter for immunization: Secondary | ICD-10-CM | POA: Diagnosis not present

## 2024-05-19 DIAGNOSIS — Z0000 Encounter for general adult medical examination without abnormal findings: Secondary | ICD-10-CM

## 2024-05-19 DIAGNOSIS — Z85828 Personal history of other malignant neoplasm of skin: Secondary | ICD-10-CM

## 2024-05-19 DIAGNOSIS — R7303 Prediabetes: Secondary | ICD-10-CM

## 2024-05-19 DIAGNOSIS — G8929 Other chronic pain: Secondary | ICD-10-CM

## 2024-05-19 DIAGNOSIS — N529 Male erectile dysfunction, unspecified: Secondary | ICD-10-CM

## 2024-05-19 DIAGNOSIS — Z125 Encounter for screening for malignant neoplasm of prostate: Secondary | ICD-10-CM | POA: Diagnosis not present

## 2024-05-19 LAB — COMPREHENSIVE METABOLIC PANEL WITH GFR
ALT: 18 U/L (ref 0–53)
AST: 24 U/L (ref 0–37)
Albumin: 4.6 g/dL (ref 3.5–5.2)
Alkaline Phosphatase: 38 U/L — ABNORMAL LOW (ref 39–117)
BUN: 13 mg/dL (ref 6–23)
CO2: 29 meq/L (ref 19–32)
Calcium: 9.6 mg/dL (ref 8.4–10.5)
Chloride: 105 meq/L (ref 96–112)
Creatinine, Ser: 1.16 mg/dL (ref 0.40–1.50)
GFR: 65.54 mL/min (ref >60.00)
Glucose, Bld: 75 mg/dL (ref 70–99)
Potassium: 4.7 meq/L (ref 3.5–5.1)
Sodium: 142 meq/L (ref 135–145)
Total Bilirubin: 0.5 mg/dL (ref 0.2–1.2)
Total Protein: 6.8 g/dL (ref 6.0–8.3)

## 2024-05-19 LAB — TSH: TSH: 7.31 u[IU]/mL — ABNORMAL HIGH (ref 0.35–5.50)

## 2024-05-19 LAB — CBC
HCT: 42.1 % (ref 39.0–52.0)
Hemoglobin: 14.1 g/dL (ref 13.0–17.0)
MCHC: 33.5 g/dL (ref 30.0–36.0)
MCV: 95.9 fl (ref 78.0–100.0)
Platelets: 218 K/uL (ref 150.0–400.0)
RBC: 4.39 Mil/uL (ref 4.22–5.81)
RDW: 13.9 % (ref 11.5–15.5)
WBC: 4.3 K/uL (ref 4.0–10.5)

## 2024-05-19 LAB — LIPID PANEL
Cholesterol: 181 mg/dL (ref 0–200)
HDL: 90.8 mg/dL (ref >39.00)
LDL Cholesterol: 78 mg/dL (ref 0–99)
NonHDL: 90.03
Total CHOL/HDL Ratio: 2
Triglycerides: 58 mg/dL (ref 0.0–149.0)
VLDL: 11.6 mg/dL (ref 0.0–40.0)

## 2024-05-19 LAB — HEMOGLOBIN A1C: Hgb A1c MFr Bld: 5.4 % (ref 4.6–6.5)

## 2024-05-19 LAB — PSA, MEDICARE: PSA: 3.6 ng/mL (ref 0.10–4.00)

## 2024-05-19 MED ORDER — LEVOTHYROXINE SODIUM 200 MCG PO TABS: 200.0000 ug | ORAL_TABLET | Freq: Every day | ORAL | 3 refills | Status: AC

## 2024-05-19 NOTE — Assessment & Plan Note (Signed)
 Chronic Lab Results  Component Value Date   HGBA1C 5.8 05/05/2023   Check a1c Low sugar / carb diet Stressed regular exercise

## 2024-05-19 NOTE — Assessment & Plan Note (Signed)
 Chronic Sees dermatology

## 2024-05-19 NOTE — Assessment & Plan Note (Signed)
 Chronic  Clinically euthyroid Check tsh and will titrate med dose if needed Continue levothyroxine  150 mcg 3 days a week, 225 mcg 4 days a week

## 2024-05-19 NOTE — Assessment & Plan Note (Signed)
 Chronic Has seen sports medicine Had MRI-2 minor bulging disks and stenosis Pain is tolerable-just have to be careful of certain activities

## 2024-05-19 NOTE — Assessment & Plan Note (Signed)
 Chronic Continue Cialis  20 mg every 48 hours prn

## 2024-05-23 DIAGNOSIS — K080 Exfoliation of teeth due to systemic causes: Secondary | ICD-10-CM | POA: Diagnosis not present

## 2024-06-22 ENCOUNTER — Ambulatory Visit (HOSPITAL_BASED_OUTPATIENT_CLINIC_OR_DEPARTMENT_OTHER): Attending: Family Medicine | Admitting: Physical Therapy

## 2024-06-22 DIAGNOSIS — M5459 Other low back pain: Secondary | ICD-10-CM | POA: Diagnosis present

## 2024-06-22 NOTE — Therapy (Signed)
 " OUTPATIENT PHYSICAL THERAPY THORACOLUMBAR TREATMENT        Patient Name: Timothy [name] MRN: [id number] DOB:[birth year], 69 y.o., male Today's Date: 06/23/2024  END OF SESSION:  PT End of Session - 06/23/23 0850       Visit Number 6     Number of Visits 8     Date for Recertification  07/13/24     Authorization Type [employer] MCR     PT Start Time 0807     PT Stop Time 0848     PT Time Calculation (min) 41 min     Activity Tolerance Patient tolerated treatment well     Behavior During Therapy [facility] for tasks assessed/performed       Past Medical History:  Diagnosis Date   Allergy    seasonal   ED (erectile dysfunction)    Bertrum syndrome    Hypothyroidism    PONV (postoperative nausea and vomiting)    ~ 30 yrs ago with appendectomy   Past Surgical History:  Procedure Laterality Date   APPENDECTOMY     BASAL CELL CARCINOMA EXCISION  08/08/2014   Benign Nevi      Removed x 3/ Dr. Lorrayne Seashore    CARPAL TUNNEL RELEASE  5/12/20007   Bilateral ; Dr Murrell   COLONOSCOPY  2005 & 2011   Normal; [facility]   MENISCUS REPAIR Bilateral    POLYPECTOMY     TA x 2 2017   SHOULDER SURGERY Bilateral    chromium decompression   TONSILLECTOMY     UMBILICAL HERNIA REPAIR     2025   Patient Active Problem List   Diagnosis Date Noted   Chronic left-sided low back pain without sciatica 01/27/2024   Prediabetes 02/13/2021   Family history of diabetes mellitus in daughter 04/10/2020   S/P arthroscopy of left shoulder 07/21/2017   Sciatica of left side 10/11/2015   History of basal cell carcinoma (BCC) 09/08/2014   ED (erectile dysfunction) 05/28/2012   Family history of ischemic heart disease 02/20/2012   ELEVATED PROSTATE SPECIFIC ANTIGEN 10/11/2008   Hypothyroidism 08/30/2007   BPH (benign prostatic hyperplasia) 08/30/2007    PCP: [name], MD  REFERRING PROVIDER: [name], MD  REFERRING DIAG: [phone] (ICD-10-CM) - Spinal stenosis of lumbar region, unspecified  whether neurogenic claudication present  Rationale for Evaluation and Treatment: Rehabilitation  THERAPY DIAG:  Other low back pain  ONSET DATE: Spring 2024  SUBJECTIVE:  SUBJECTIVE STATEMENT: Pt states he didn't perform exercises or recreational activities including pickleball from Christmas Day until New Year's Eve and his back felt great.  He had no pain.  Since he has returned to his exercises and activities, his pain has returned to baseline.  Pt has no pain in sitting currently though can have a 4-5/10 pain when standing up.    he felt a difference with cupping after prior treatment.   Pt states if he was at a pain level 3-4/10 at the beginning of treatment, he is now at a 1-3/10.  When I get up in the AM, it can be a 0/10 then it slowly tightens up again.  Pt states his pain does not limit him with activities.  Pt reports improved pain 1st thing in AM, rolling over in bed, and the transition from lying to sitting.  Pt reports almost a 50% improvement in pain with transitional movements and a 50% improvement in pain with standing up after sitting awhile and bed mobility.   Pt is compliant with HEP.  He states the exercises bring him relief.     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 Location:  L sided lumbar  PRECAUTIONS: Other: per dx and MRI findings   WEIGHT BEARING RESTRICTIONS: No  FALLS:  Has patient fallen in last 6 months? No  LIVING ENVIRONMENT: Lives with: lives with their  spouse Lives in: 2 story home, but primarily stays on 1st floor Stairs: yes   OCCUPATION: retired  PLOF: Independent  PATIENT GOALS: to have no pain, learn ways to strengthen to not get worse   OBJECTIVE:  Note: Objective measures were completed at Evaluation unless otherwise noted.  DIAGNOSTIC FINDINGS:  Lumbar MRI: Disc levels:   T11-T12: Mild disc desiccation, disc space loss. Minimal disc bulging and no stenosis is evident.   T12-L1:  Negative.   L1-L2: Disc space loss and circumferential disc bulge. No spinal stenosis. Mild lateral recess and foraminal stenosis bilaterally.   L2-L3: Similar disc space loss. Circumferential disc osteophyte complex asymmetric to the left, left far lateral component. Mild facet and ligament flavum hypertrophy. Mild spinal stenosis and moderate left lateral recess stenosis (left L3 nerve level series 105, image 19). Moderate left L2 neural foraminal stenosis. Mild if any right foraminal stenosis.   L3-L4: Disc desiccation and more symmetric circumferential disc bulging. Up to moderate facet and ligament flavum hypertrophy greater on the right. Moderate spinal stenosis (series 105, image 26). Fairly symmetric lateral recess involvement. Moderate left and mild right L3 neural foraminal stenosis (series 104, image 12 on the left).   L4-L5: Disc desiccation circumferential disc bulge. Small posterior annular fissures of the disc, including at the left lateral recess on series 104, image 12 (descending left L5 nerve level). Mild to moderate facet and ligament flavum hypertrophy. No spinal stenosis. No convincing lateral recess stenosis. Mild L4 neural foraminal stenosis greater on the right.   L5-S1: Mild retrolisthesis. Disc space loss and circumferential disc osteophyte complex with a right paracentral posterior component (series 105, image 40). Less posterior element hypertrophy here. No spinal stenosis. Mild to moderate right  lateral recess stenosis (  right S1 nerve level series 104, image 7). Mild to moderate right L5 neural foraminal stenosis (series 104, image 5).   IMPRESSION: 1. Dextroconvex lumbar scoliosis with multilevel disc and endplate degeneration. Moderate degenerative endplate marrow edema radiating to the left at L2-L3 (see #3). No other acute osseous abnormality. 2. Moderate multifactorial spinal stenosis at L3-L4, with moderate left neural foraminal stenosis. 3. Mild spinal stenosis at L2-L3 with moderate left lateral recess and left foraminal stenosis. 4. Mild to moderate right lateral recess and right foraminal stenosis at L5-S1.  Lumbar x ray: FINDINGS: There is no evidence of lumbar spine fracture. Alignment is normal. Mild multilevel intervertebral disc space narrowing with anterior disc osteophyte formation spanning L1-S1 with facet arthropathy most pronounced at L4-S1.   IMPRESSION: Mild multilevel degenerative changes of the lumbar spine.  Hip x rays: IMPRESSION: 1. Mild-to-moderate osteoarthritis of the right hip with diminished cut back of the right femoral head and neck junction and pincer morphology of the right acetabulum, which can predispose to mixed type femoroacetabular impingement. 2. Mild osteoarthritis of the left hip with diminished cut back of the left femoral head neck junction, which can predispose to CAM type femoroacetabular impingement. 3. No acute osseous abnormality.  PATIENT SURVEYS:  Modified Oswestry Disability Index:  6% Initial 3% current  COGNITION: Overall cognitive status: Within functional limits for tasks assessed     MUSCLE LENGTH: Hamstrings:  minimal tightness in bilat HS   PALPATION: Soft tissue tightness in bilat lumbar paraspinals.  Pt has tenderness in R sided lumbar paraspinals.  LUMBAR ROM:   AROM eval  Flexion WNL  Extension WFL without pain  Right lateral flexion WFL  Left lateral flexion WFL  Right rotation WNL   Left rotation WNL   (Blank rows = not tested)  LOWER EXTREMITY ROM:     Active  Right eval Left eval  Hip flexion    Hip extension    Hip abduction [facility]  Hip adduction    Hip internal rotation    Hip external rotation    Knee flexion    Knee extension [facility]  Ankle dorsiflexion    Ankle plantarflexion    Ankle inversion    Ankle eversion     (Blank rows = not tested)  LOWER EXTREMITY MMT:    MMT Right eval Left eval  Hip flexion 5/5 5/5  Hip extension    Hip abduction 5/5 5/5  Hip adduction    Hip internal rotation    Hip external rotation 5/5 5/5  Knee flexion 5/5 5/5  Knee extension 5/5 5/5  Ankle dorsiflexion 5/5 5/5  Ankle plantarflexion WFL seated WFL seated  Ankle inversion    Ankle eversion     (Blank rows = not tested)     GAIT:  Assistive device utilized: None Level of assistance: Complete Independence Comments: good heel to toe gait, no limping.  Limited reciprocal arm swing.   TREATMENT:  Elliptical x 5 mins PT spent time answering questions.   PT provided education concerning sx's, exercises, appropriate sx response, activity level/intensity, dx, home management strategies, and relevant anatomy.  Manual Therapy: STM to bilat lumbar paraspinals f/b static and dynamic cupping to bilat lumbar paraspinals and thoracic paraspinals.      PATIENT EDUCATION:  Education details:  Dx, prognosis, HEP, posture, POC, rationale of interventions, appropriate response to exercises/activities.  Relevant anatomy.  PT answered pt's questions.   Person educated: Patient Education method: Explanation, Demonstration, handout Education comprehension:  verbalized understanding, returned demonstration  HOME EXERCISE PROGRAM: Access Code: [email or web address] URL: [email or web address] Date: 03/31/2024 Prepared by: Mose Minerva  Exercises - Supine Transversus Abdominis Bracing - Hands on Stomach  - 2 x daily - 7 x weekly - 2 sets - 10 reps - 5 seconds hold - Standing Paraspinals Mobilization with Small Ball on Wall  - 7 x weekly  Access Code: [email or web address] URL: [email or web address] Date: 04/21/2024 Prepared by: Mose Minerva  Exercises - Supine Bridge with clamshells - 5 x weekly - 3 sets - 10 reps - Supine Core Control with Leg Extension  - 1 x daily - 7 x weekly - 2 sets - 10 reps - Hooklying Lumbar Rotation  - 2 x daily - 7 x weekly - 2 sets - 10 reps - Standing Anti-Rotation Press with Anchored Resistance  - 1 x daily - 5 x weekly - 2 sets - 10 reps  ASSESSMENT:  CLINICAL IMPRESSION:  Pt took time off from exercises and activities during the holidays and felt better having no pain.  When he resumed activities, his pain returned.  PT educated pt concerning sx's, sx response, exercises, activity level/intensity, dx, and anatomy.  PT spent time answering pt's questions.  Pt has improved soft tissue tightness and mobility as evidenced by palpation.  Pt had no discomfort or tenderness with STM.  However, pt does have soft tissue and myofascial tightness as evidenced by response to cupping.  Pt had multiple petichiae in bilat lumbar paraspinals and lower thoracic paraspinals.  PT instructed pt to drink plenty of water.  He responded well to manual techniques stating he felt better including reduced tightness after STM and cupping.    OBJECTIVE IMPAIRMENTS: increased fascial restrictions and pain.   ACTIVITY LIMITATIONS: transfers and bed mobility  PARTICIPATION LIMITATIONS:   PERSONAL FACTORS: 1-2 comorbidities: Hip OA and L knee meniscus repair are also affecting patient's functional outcome.   REHAB POTENTIAL: Good  CLINICAL DECISION MAKING: Stable/uncomplicated  EVALUATION COMPLEXITY: Low   GOALS:   SHORT TERM GOALS: Target date: 04/20/2024   Pt will report at least a 25%  improvement in pain with transitional movements including transfers and bed mobility.  Baseline: Goal status: GOAL MET  12/10    LONG TERM GOALS: Target date: 07/13/2024  Pt will demo improved soft tissue tightness and tenderness in lumbar paraspinals for reduced tension in lumbar and improved functional mobility.  Baseline:  Goal status: PROGRESSING  12/10  2.  Pt will report at least a 70% improvement in pain with standing up after sitting awhile and bed mobility.  Baseline:  Goal status: PROGRESSING  12/10  3.  Pt will be independent and compliant with HEP for improved pain, core strength, and functional mobility.  Baseline:  Goal status: GOAL MET 12/10   PLAN:  PT FREQUENCY: 2-3 more visits  PT DURATION: 8 weeks  PLANNED INTERVENTIONS: 97164- PT Re-evaluation, 97750- Physical Performance Testing, 97110-Therapeutic exercises, 97530- Therapeutic activity, [id number]- Neuromuscular  re-education, [phone]- Self Care, 02859- Manual therapy, [phone]- Gait training, [phone]- Aquatic Therapy, [phone]- Electrical stimulation (unattended), [phone]- Electrical stimulation (manual), [id number]- Ultrasound, [phone] (1-2 muscles), [id number] (3+ muscles)- Dry Needling, Patient/Family education, Stair training, Taping, Joint mobilization, Cryotherapy, and Moist heat.  PLAN FOR NEXT SESSION: core strengthening.  STM and TPR to lumbar paraspinals.  Cont with cupping.   Leigh Minerva III PT, DPT 06/23/24 8:45 PM        "

## 2024-06-23 ENCOUNTER — Encounter (HOSPITAL_BASED_OUTPATIENT_CLINIC_OR_DEPARTMENT_OTHER): Payer: Self-pay | Admitting: Physical Therapy

## 2024-07-21 ENCOUNTER — Encounter (HOSPITAL_BASED_OUTPATIENT_CLINIC_OR_DEPARTMENT_OTHER): Admitting: Physical Therapy

## 2025-05-23 ENCOUNTER — Encounter: Admitting: Internal Medicine

## 2025-05-23 ENCOUNTER — Ambulatory Visit
